# Patient Record
Sex: Female | Born: 1985 | Race: White | Hispanic: No | Marital: Married | Smoking: Former smoker
Health system: Southern US, Community
[De-identification: ages and names within clinical notes are randomized; demographics above are authoritative.]

## PROBLEM LIST (undated history)

## (undated) DIAGNOSIS — G43909 Migraine, unspecified, not intractable, without status migrainosus: Secondary | ICD-10-CM

## (undated) DIAGNOSIS — H3320 Serous retinal detachment, unspecified eye: Secondary | ICD-10-CM

## (undated) DIAGNOSIS — G35 Multiple sclerosis: Secondary | ICD-10-CM

## (undated) HISTORY — PX: EYE SURGERY: SHX253

---

## 2020-09-08 ENCOUNTER — Inpatient Hospital Stay (HOSPITAL_COMMUNITY)
Admission: EM | Admit: 2020-09-08 | Discharge: 2020-09-13 | DRG: 060 | Disposition: A | Discharge status: Home / Self Care | Payer: 59 | Attending: Internal Medicine | Admitting: Internal Medicine

## 2020-09-08 ENCOUNTER — Encounter (HOSPITAL_COMMUNITY): Payer: Self-pay

## 2020-09-08 DIAGNOSIS — Z87891 Personal history of nicotine dependence: Secondary | ICD-10-CM

## 2020-09-08 DIAGNOSIS — T380X5A Adverse effect of glucocorticoids and synthetic analogues, initial encounter: Secondary | ICD-10-CM | POA: Diagnosis present

## 2020-09-08 DIAGNOSIS — Z8669 Personal history of other diseases of the nervous system and sense organs: Secondary | ICD-10-CM

## 2020-09-08 DIAGNOSIS — Z20822 Contact with and (suspected) exposure to covid-19: Secondary | ICD-10-CM | POA: Diagnosis present

## 2020-09-08 DIAGNOSIS — R739 Hyperglycemia, unspecified: Secondary | ICD-10-CM

## 2020-09-08 DIAGNOSIS — E663 Overweight: Secondary | ICD-10-CM

## 2020-09-08 DIAGNOSIS — G35 Multiple sclerosis: Principal | ICD-10-CM | POA: Diagnosis present

## 2020-09-08 DIAGNOSIS — G43909 Migraine, unspecified, not intractable, without status migrainosus: Secondary | ICD-10-CM | POA: Diagnosis present

## 2020-09-08 DIAGNOSIS — R200 Anesthesia of skin: Secondary | ICD-10-CM | POA: Diagnosis present

## 2020-09-08 DIAGNOSIS — R55 Syncope and collapse: Secondary | ICD-10-CM

## 2020-09-08 DIAGNOSIS — R278 Other lack of coordination: Secondary | ICD-10-CM | POA: Diagnosis present

## 2020-09-08 DIAGNOSIS — Z6828 Body mass index (BMI) 28.0-28.9, adult: Secondary | ICD-10-CM

## 2020-09-08 DIAGNOSIS — Z833 Family history of diabetes mellitus: Secondary | ICD-10-CM

## 2020-09-08 HISTORY — DX: Serous retinal detachment, unspecified eye: H33.20

## 2020-09-08 HISTORY — DX: Migraine, unspecified, not intractable, without status migrainosus: G43.909

## 2020-09-08 HISTORY — DX: Multiple sclerosis: G35

## 2020-09-08 LAB — DIFFERENTIAL
Abs Immature Granulocytes: 0.01 10*3/uL (ref 0.00–0.07)
Basophils Absolute: 0 10*3/uL (ref 0.0–0.1)
Basophils Relative: 1 %
Eosinophils Absolute: 0.1 10*3/uL (ref 0.0–0.5)
Eosinophils Relative: 1 %
Immature Granulocytes: 0 %
Lymphocytes Relative: 35 %
Lymphs Abs: 2.7 10*3/uL (ref 0.7–4.0)
Monocytes Absolute: 0.5 10*3/uL (ref 0.1–1.0)
Monocytes Relative: 6 %
Neutro Abs: 4.5 10*3/uL (ref 1.7–7.7)
Neutrophils Relative %: 57 %

## 2020-09-08 LAB — I-STAT CHEM 8, ED
BUN: 14 mg/dL (ref 6–20)
Calcium, Ion: 1.23 mmol/L (ref 1.15–1.40)
Chloride: 103 mmol/L (ref 98–111)
Creatinine, Ser: 0.6 mg/dL (ref 0.44–1.00)
Glucose, Bld: 89 mg/dL (ref 70–99)
HCT: 40 % (ref 36.0–46.0)
Hemoglobin: 13.6 g/dL (ref 12.0–15.0)
Potassium: 3.6 mmol/L (ref 3.5–5.1)
Sodium: 139 mmol/L (ref 135–145)
TCO2: 26 mmol/L (ref 22–32)

## 2020-09-08 LAB — COMPREHENSIVE METABOLIC PANEL
ALT: 15 U/L (ref 0–44)
AST: 15 U/L (ref 15–41)
Albumin: 4.6 g/dL (ref 3.5–5.0)
Alkaline Phosphatase: 56 U/L (ref 38–126)
Anion gap: 12 (ref 5–15)
BUN: 12 mg/dL (ref 6–20)
CO2: 23 mmol/L (ref 22–32)
Calcium: 9.7 mg/dL (ref 8.9–10.3)
Chloride: 102 mmol/L (ref 98–111)
Creatinine, Ser: 0.65 mg/dL (ref 0.44–1.00)
GFR, Estimated: >60 mL/min (ref >60)
Glucose, Bld: 94 mg/dL (ref 70–99)
Potassium: 3.6 mmol/L (ref 3.5–5.1)
Sodium: 137 mmol/L (ref 135–145)
Total Bilirubin: 0.7 mg/dL (ref 0.3–1.2)
Total Protein: 7.5 g/dL (ref 6.5–8.1)

## 2020-09-08 LAB — CBC
HCT: 40.2 % (ref 36.0–46.0)
Hemoglobin: 14.2 g/dL (ref 12.0–15.0)
MCH: 31.9 pg (ref 26.0–34.0)
MCHC: 35.3 g/dL (ref 30.0–36.0)
MCV: 90.3 fL (ref 80.0–100.0)
Platelets: 226 10*3/uL (ref 150–400)
RBC: 4.45 MIL/uL (ref 3.87–5.11)
RDW: 11.9 % (ref 11.5–15.5)
WBC: 7.8 10*3/uL (ref 4.0–10.5)
nRBC: 0 % (ref 0.0–0.2)

## 2020-09-08 LAB — I-STAT BETA HCG BLOOD, ED (MC, WL, AP ONLY): I-stat hCG, quantitative: <5 m[IU]/mL (ref <5)

## 2020-09-08 LAB — PROTIME-INR
INR: 1 (ref 0.8–1.2)
Prothrombin Time: 12.4 seconds (ref 11.4–15.2)

## 2020-09-08 LAB — APTT: aPTT: 29 seconds (ref 24–36)

## 2020-09-08 MED ORDER — SODIUM CHLORIDE 0.9% FLUSH: 3.0000 mL | Freq: Once | INTRAVENOUS | Status: DC

## 2020-09-08 NOTE — ED Triage Notes (Signed)
Pt states that she has had a headache for a weak and slurred speech last week that has resolved, today had a syncopal episode, pt also reports bilateral hand weakness for the past 3 days.

## 2020-09-09 ENCOUNTER — Other Ambulatory Visit: Payer: Self-pay

## 2020-09-09 ENCOUNTER — Emergency Department (HOSPITAL_COMMUNITY): Payer: 59

## 2020-09-09 ENCOUNTER — Encounter (HOSPITAL_COMMUNITY): Payer: Self-pay | Admitting: Internal Medicine

## 2020-09-09 DIAGNOSIS — G35 Multiple sclerosis: Secondary | ICD-10-CM | POA: Diagnosis present

## 2020-09-09 DIAGNOSIS — G35D Multiple sclerosis, unspecified: Secondary | ICD-10-CM | POA: Diagnosis present

## 2020-09-09 DIAGNOSIS — R739 Hyperglycemia, unspecified: Secondary | ICD-10-CM | POA: Diagnosis not present

## 2020-09-09 LAB — CBG MONITORING, ED
Glucose-Capillary: 66 mg/dL — ABNORMAL LOW (ref 70–99)
Glucose-Capillary: 109 mg/dL — ABNORMAL HIGH (ref 70–99)
Glucose-Capillary: 85 mg/dL (ref 70–99)

## 2020-09-09 LAB — RESP PANEL BY RT-PCR (FLU A&B, COVID) ARPGX2
Influenza A by PCR: NEGATIVE
Influenza B by PCR: NEGATIVE
SARS Coronavirus 2 by RT PCR: NEGATIVE

## 2020-09-09 LAB — CBC
HCT: 40.4 % (ref 36.0–46.0)
Hemoglobin: 13.8 g/dL (ref 12.0–15.0)
MCH: 31.2 pg (ref 26.0–34.0)
MCHC: 34.2 g/dL (ref 30.0–36.0)
MCV: 91.2 fL (ref 80.0–100.0)
Platelets: 206 10*3/uL (ref 150–400)
RBC: 4.43 MIL/uL (ref 3.87–5.11)
RDW: 11.9 % (ref 11.5–15.5)
WBC: 8 10*3/uL (ref 4.0–10.5)
nRBC: 0 % (ref 0.0–0.2)

## 2020-09-09 LAB — CREATININE, SERUM
Creatinine, Ser: 0.61 mg/dL (ref 0.44–1.00)
GFR, Estimated: >60 mL/min (ref >60)

## 2020-09-09 MED ORDER — SODIUM CHLORIDE 0.9 % IV SOLN
1000.0000 mg | Freq: Every day | INTRAVENOUS | Status: AC
  Administered 2020-09-09 – 2020-09-13 (×5): 1000 mg via INTRAVENOUS
  Filled 2020-09-09 (×5): qty 8

## 2020-09-09 MED ORDER — PANTOPRAZOLE SODIUM 40 MG PO TBEC
40.0000 mg | DELAYED_RELEASE_TABLET | Freq: Every day | ORAL | Status: DC
  Administered 2020-09-10 – 2020-09-12 (×3): 40 mg via ORAL
  Filled 2020-09-09 (×3): qty 1

## 2020-09-09 MED ORDER — KETOROLAC TROMETHAMINE 30 MG/ML IJ SOLN
30.0000 mg | Freq: Once | INTRAMUSCULAR | Status: AC
  Administered 2020-09-09: 21:00:00 30 mg via INTRAVENOUS
  Filled 2020-09-09: qty 1

## 2020-09-09 MED ORDER — ENOXAPARIN SODIUM 40 MG/0.4ML ~~LOC~~ SOLN
40.0000 mg | SUBCUTANEOUS | Status: DC
  Administered 2020-09-09 – 2020-09-12 (×4): 40 mg via SUBCUTANEOUS
  Filled 2020-09-09 (×4): qty 0.4

## 2020-09-09 MED ORDER — ACETAMINOPHEN 325 MG PO TABS
650.0000 mg | ORAL_TABLET | Freq: Four times a day (QID) | ORAL | Status: DC | PRN
  Filled 2020-09-09: qty 2

## 2020-09-09 MED ORDER — INSULIN ASPART 100 UNIT/ML ~~LOC~~ SOLN: 0.0000 [IU] | Freq: Three times a day (TID) | SUBCUTANEOUS | Status: DC

## 2020-09-09 MED ORDER — ACETAMINOPHEN 650 MG RE SUPP: 650.0000 mg | Freq: Four times a day (QID) | RECTAL | Status: DC | PRN

## 2020-09-09 MED ORDER — GADOBUTROL 1 MMOL/ML IV SOLN: 8.0000 mL | Freq: Once | INTRAVENOUS | Status: DC | PRN

## 2020-09-09 MED ORDER — GADOBUTROL 1 MMOL/ML IV SOLN
8.0000 mL | Freq: Once | INTRAVENOUS | Status: AC | PRN
  Administered 2020-09-09: 8 mL via INTRAVENOUS

## 2020-09-09 NOTE — ED Notes (Signed)
Patient transported to MRI 

## 2020-09-09 NOTE — ED Notes (Addendum)
Pt stated that pt seen other pt going before pt, this NT stated that pt are place by acuity and pt stated that this NT notified pt was next. This NT stated that pt are placed by acuity and never told pt was next by this NT. Pt story changed to other colleagues notified pt was next, and that pt misunderstood when pt's was not important as other pt's. This NT stated that all pt's are important especially pt's care for their health.

## 2020-09-09 NOTE — ED Provider Notes (Signed)
Care assumed at shift change from [facility], [place], pending MRI brain and c-spine. See their note for full HPI and workup. Briefly, pt presenting with neuro symptoms concerning for MS flare as well as syncopal episode yesterday. Syncope workup is unremarkable. Plan for MRI to eval for findings concerning for active MS flare.   Patient reports 3-4 days of right face, arm, leg weakness. Intermittent slurred speech, right sided headache x1 week. Imbalance. Floaters in b/l vision. Feels like previous flare though usually on the left. Was evaluated previously at [facility] where she just moved her from this summer. Prior to that, [facility] in [place].  Per chart review and care everywhere, [facility] records, patient has history of retinal detachment of both eyes, optic neuritis, uveitis.  She had vitrectomy done on 10/13/2019. Physical Exam  BP 125/74 (BP Location: Left Arm)   Pulse 65   Temp 98.3 F (36.8 C) (Oral)   Resp 16   Ht 5\' 6"  (1.676 m)   Wt 79.4 kg   SpO2 100%   BMI 28.25 kg/m   Physical Exam Vitals and nursing note reviewed.  Constitutional:      General: She is not in acute distress.    Appearance: She is well-developed.  HENT:     Head: Normocephalic and atraumatic.  Eyes:     Conjunctiva/sclera: Conjunctivae normal.  Cardiovascular:     Rate and Rhythm: Normal rate.  Pulmonary:     Effort: Pulmonary effort is normal.  Neurological:     Mental Status: She is alert.     Comments: Speech is fluent. Spontaneously moving all extremities without difficulty.  Psychiatric:        Mood and Affect: Mood normal.        Behavior: Behavior normal.     ED Course/Procedures   Clinical Course as of 09/10/20 2321  Fri Sep 09, 2020  2022 Patient is hypoglycemic despite a full meal a few hours ago.  She is given juice and additional food. [JR]  2050 Consulted with neurologist, Dr. 2051.  He recommends medical admission, 1000 mg of methylprednisolone IV once  daily.  He will evaluate patient.  Discussed these recommendations with patient, she verbalized understanding and agrees with care plan for admission.  She is vaccinated against Covid, will send swab. [JR]    Clinical Course User Index [JR] Jaleiah Asay, Otelia Limes N, PA-C    Procedures  Results for orders placed or performed during the hospital encounter of 09/08/20  Resp Panel by RT-PCR (Flu A&B, Covid) Nasopharyngeal Swab   Specimen: Nasopharyngeal Swab; Nasopharyngeal(NP) swabs in vial transport medium  Result Value Ref Range   SARS Coronavirus 2 by RT PCR NEGATIVE NEGATIVE   Influenza A by PCR NEGATIVE NEGATIVE   Influenza B by PCR NEGATIVE NEGATIVE  Protime-INR  Result Value Ref Range   Prothrombin Time 12.4 11.4 - 15.2 seconds   INR 1.0 0.8 - 1.2  APTT  Result Value Ref Range   aPTT 29 24 - 36 seconds  CBC  Result Value Ref Range   WBC 7.8 4.0 - 10.5 K/uL   RBC 4.45 3.87 - 5.11 MIL/uL   Hemoglobin 14.2 12.0 - 15.0 g/dL   HCT 09/10/20 42.3 - 53.6 %   MCV 90.3 80.0 - 100.0 fL   MCH 31.9 26.0 - 34.0 pg   MCHC 35.3 30.0 - 36.0 g/dL   RDW 14.4 31.5 - 40.0 %   Platelets 226 150 - 400 K/uL   nRBC 0.0 0.0 -  0.2 %  Differential  Result Value Ref Range   Neutrophils Relative % 57 %   Neutro Abs 4.5 1.7 - 7.7 K/uL   Lymphocytes Relative 35 %   Lymphs Abs 2.7 0.7 - 4.0 K/uL   Monocytes Relative 6 %   Monocytes Absolute 0.5 0.1 - 1.0 K/uL   Eosinophils Relative 1 %   Eosinophils Absolute 0.1 0.0 - 0.5 K/uL   Basophils Relative 1 %   Basophils Absolute 0.0 0.0 - 0.1 K/uL   Immature Granulocytes 0 %   Abs Immature Granulocytes 0.01 0.00 - 0.07 K/uL  Comprehensive metabolic panel  Result Value Ref Range   Sodium 137 135 - 145 mmol/L   Potassium 3.6 3.5 - 5.1 mmol/L   Chloride 102 98 - 111 mmol/L   CO2 23 22 - 32 mmol/L   Glucose, Bld 94 70 - 99 mg/dL   BUN 12 6 - 20 mg/dL   Creatinine, Ser 0.62 0.44 - 1.00 mg/dL   Calcium 9.7 8.9 - 69.4 mg/dL   Total Protein 7.5 6.5 - 8.1 g/dL    Albumin 4.6 3.5 - 5.0 g/dL   AST 15 15 - 41 U/L   ALT 15 0 - 44 U/L   Alkaline Phosphatase 56 38 - 126 U/L   Total Bilirubin 0.7 0.3 - 1.2 mg/dL   GFR, Estimated >85 >46 mL/min   Anion gap 12 5 - 15  HIV Antibody (routine testing w rflx)  Result Value Ref Range   HIV Screen 4th Generation wRfx Reactive (A) Non Reactive  CBC  Result Value Ref Range   WBC 8.0 4.0 - 10.5 K/uL   RBC 4.43 3.87 - 5.11 MIL/uL   Hemoglobin 13.8 12.0 - 15.0 g/dL   HCT 27.0 35.0 - 09.3 %   MCV 91.2 80.0 - 100.0 fL   MCH 31.2 26.0 - 34.0 pg   MCHC 34.2 30.0 - 36.0 g/dL   RDW 81.8 29.9 - 37.1 %   Platelets 206 150 - 400 K/uL   nRBC 0.0 0.0 - 0.2 %  Creatinine, serum  Result Value Ref Range   Creatinine, Ser 0.61 0.44 - 1.00 mg/dL   GFR, Estimated >69 >67 mL/min  Basic metabolic panel  Result Value Ref Range   Sodium 139 135 - 145 mmol/L   Potassium 4.6 3.5 - 5.1 mmol/L   Chloride 103 98 - 111 mmol/L   CO2 24 22 - 32 mmol/L   Glucose, Bld 158 (H) 70 - 99 mg/dL   BUN 14 6 - 20 mg/dL   Creatinine, Ser 8.93 0.44 - 1.00 mg/dL   Calcium 81.0 8.9 - 17.5 mg/dL   GFR, Estimated >10 >25 mL/min   Anion gap 12 5 - 15  CBC  Result Value Ref Range   WBC 8.1 4.0 - 10.5 K/uL   RBC 4.54 3.87 - 5.11 MIL/uL   Hemoglobin 14.1 12.0 - 15.0 g/dL   HCT 85.2 77.8 - 24.2 %   MCV 89.4 80.0 - 100.0 fL   MCH 31.1 26.0 - 34.0 pg   MCHC 34.7 30.0 - 36.0 g/dL   RDW 35.3 61.4 - 43.1 %   Platelets 203 150 - 400 K/uL   nRBC 0.0 0.0 - 0.2 %  Glucose, capillary  Result Value Ref Range   Glucose-Capillary 205 (H) 70 - 99 mg/dL  Hemoglobin V4M  Result Value Ref Range   Hgb A1c MFr Bld 4.8 4.8 - 5.6 %   Mean Plasma Glucose 91.06 mg/dL  I-stat  chem 8, ED  Result Value Ref Range   Sodium 139 135 - 145 mmol/L   Potassium 3.6 3.5 - 5.1 mmol/L   Chloride 103 98 - 111 mmol/L   BUN 14 6 - 20 mg/dL   Creatinine, Ser 5.40 0.44 - 1.00 mg/dL   Glucose, Bld 89 70 - 99 mg/dL   Calcium, Ion 9.81 1.91 - 1.40 mmol/L   TCO2 26 22 - 32  mmol/L   Hemoglobin 13.6 12.0 - 15.0 g/dL   HCT 47.8 29.5 - 62.1 %  CBG monitoring, ED  Result Value Ref Range   Glucose-Capillary 66 (L) 70 - 99 mg/dL  I-Stat beta hCG blood, ED  Result Value Ref Range   I-stat hCG, quantitative <5.0 <5 mIU/mL   Comment 3          CBG monitoring, ED  Result Value Ref Range   Glucose-Capillary 109 (H) 70 - 99 mg/dL   Comment 1 Repeat Test   CBG monitoring, ED  Result Value Ref Range   Glucose-Capillary 85 70 - 99 mg/dL   MR Brain W and Wo Contrast  Result Date: 09/09/2020 CLINICAL DATA:  Multiple sclerosis.  New event. EXAM: MRI HEAD WITHOUT AND WITH CONTRAST TECHNIQUE: Multiplanar, multiecho pulse sequences of the brain and surrounding structures were obtained without and with intravenous contrast. CONTRAST:  8mL GADAVIST GADOBUTROL 1 MMOL/ML IV SOLN COMPARISON:  None. FINDINGS: Brain: Diffusion imaging does not show any true restricted diffusion. Multiple areas of low level T2 shine through related to multiple MS plaques. There is a 12 mm MS lesion of the left pons that show some swelling and contrast enhancement consistent with an active lesion. There is an 8 mm lesion in the left middle cerebellar peduncle with peripheral enhancement. No other posterior fossa lesion. Right cerebral hemisphere shows approximately 20-30 demyelinating lesions scattered within the deep and subcortical white matter. Several show contrast enhancement. The left hemisphere likewise shows 20-30 demyelinating lesions, many more showing abnormal contrast enhancement. Moderate involvement of the corpus callosum. No sign of neoplastic mass lesion, hemorrhage, hydrocephalus or extra-axial collection. Vascular: Major vessels at the base of the brain show flow. Skull and upper cervical spine: Negative Sinuses/Orbits: Clear sinuses.  Previous scleral banding procedures. Other: None IMPRESSION: MS lesions distributed throughout the brain including the pons, left cerebellum and extensively  throughout both cerebral hemispheres, many of which show abnormal contrast enhancement suggesting active nature. There are approximately 20-30 lesions within each cerebral hemisphere. Electronically Signed   By: Paulina Fusi M.D.   On: 09/09/2020 19:32   MR Cervical Spine W or Wo Contrast  Result Date: 09/09/2020 CLINICAL DATA:  Multiple sclerosis.  New clinical of vent. EXAM: MRI CERVICAL SPINE WITHOUT AND WITH CONTRAST TECHNIQUE: Multiplanar and multiecho pulse sequences of the cervical spine, to include the craniocervical junction and cervicothoracic junction, were obtained without and with intravenous contrast. CONTRAST:  8mL GADAVIST GADOBUTROL 1 MMOL/ML IV SOLN COMPARISON:  None. FINDINGS: Alignment: Normal Vertebrae: Normal Cord: No convincing or definite MS lesion of the cervical spine. I question the presence of a subtle lesion on the right at the C4 level. No other area of concern. No abnormal contrast enhancement occurs there or elsewhere. Posterior Fossa, vertebral arteries, paraspinal tissues: See results of brain MRI. Disc levels: No significant degenerative changes. No stenosis of the canal or foramina. IMPRESSION: No convincing or definite MS lesion in the cervical spine. I question the presence of a subtle lesion on the right at the C4 level. No  abnormal contrast enhancement. No other area of concern. Electronically Signed   By: Paulina Fusi M.D.   On: 09/09/2020 19:40    MDM  MRI with findings consistent with new MS lesions. Consulted with neurology, Dr. Otelia Limes, who reviewed imaging and recommends admission for IV steroids. Patient agreeable with plan.  Pt admitted to medicine service.     Kamillah Didonato, [place] N, PA-C 09/10/20 2326    Tilden Fossa, MD 09/12/20 1131

## 2020-09-09 NOTE — H&P (Signed)
History and Physical    Alice Travis WUJ:[id number] DOB: [birth year] DOA: 09/08/2020  PCP: Patient, No Pcp Per  Patient coming from: Home.  Chief Complaint: Right-sided weakness.  HPI: Alice Travis is a 34 y.o. female with history of multiple sclerosis and retinal detachment who has just recently moved from [place] with known history of multiple sclerosis not on any disease modifying medication has been experiencing right-sided upper and lower extremity and right facial numbness with weakness with right-sided headache for the last 4 days.  Has some difficulty with gait.  Denies any incontinence of urine or bowel and has been some floaters in both eyes.  ED Course: In the ER on exam patient has mild weakness of the right upper and lower extremity.  MRI brain and C-spine was done which shows features concerning for exacerbation of multiple sclerosis and on-call neurologist Dr. Otelia Limes was consulted and started on Solu-Medrol 1 g IV and admitted for further observation.  CBC and complete metabolic panel done on September 08, 2020 was unremarkable repeat urine is pending.  Covid test was negative.  EKG shows normal sinus rhythm.  Patient also was given Toradol for the headache.  Review of Systems: As per HPI, rest all negative.   Past Medical History:  Diagnosis Date  . Migraines   . MS (multiple sclerosis) (HCC)   . Retinal detachment     Past Surgical History:  Procedure Laterality Date  . EYE SURGERY       reports that she has quit smoking. She has never used smokeless tobacco. She reports previous alcohol use. She reports that she does not use drugs.  No Known Allergies  Family History  Problem Relation Age of Onset  . Diabetes Mellitus II Father     Prior to Admission medications   Not on File    Physical Exam: Constitutional: Moderately built and nourished. Vitals:   09/09/20 1446 09/09/20 1510 09/09/20 1730 09/09/20 2024  BP:  125/74 130/69 107/74  Pulse:   65 66 76  Resp:  16 15 18   Temp:  98.3 F (36.8 C)  98.7 F (37.1 C)  TempSrc:  Oral  Oral  SpO2:  100% 100% 100%  Weight: 79.4 kg     Height: 5\' 6"  (1.676 m)      Eyes: Anicteric no pallor. ENMT: No discharge from the ears eyes nose or mouth. Neck: No mass felt.  No neck rigidity. Respiratory: No rhonchi or crepitations. Cardiovascular: S1-S2 heard. Abdomen: Soft nontender bowel sounds present. Musculoskeletal: No edema. Skin: No rash. Neurologic: Alert awake oriented to time place and person.  Right upper and lower extremities around 4 x 5 in strength.  No facial asymmetry tongue is midline pupils equal and reacting to light. Psychiatric: Appears normal.  Normal affect.   Labs on Admission: I have personally reviewed following labs and imaging studies  CBC: Recent Labs  Lab 09/08/20 2023 09/08/20 2127  WBC 7.8  --   NEUTROABS 4.5  --   HGB 14.2 13.6  HCT 40.2 40.0  MCV 90.3  --   PLT 226  --    Basic Metabolic Panel: Recent Labs  Lab 09/08/20 2023 09/08/20 2127  NA 137 139  K 3.6 3.6  CL 102 103  CO2 23  --   GLUCOSE 94 89  BUN 12 14  CREATININE 0.65 0.60  CALCIUM 9.7  --    GFR: Estimated Creatinine Clearance: 106.3 mL/min (by C-G formula based on SCr of 0.6 mg/dL). Liver  Function Tests: Recent Labs  Lab 09/08/20 2023  AST 15  ALT 15  ALKPHOS 56  BILITOT 0.7  PROT 7.5  ALBUMIN 4.6   No results for input(s): LIPASE, AMYLASE in the last 168 hours. No results for input(s): AMMONIA in the last 168 hours. Coagulation Profile: Recent Labs  Lab 09/08/20 2023  INR 1.0   Cardiac Enzymes: No results for input(s): CKTOTAL, CKMB, CKMBINDEX, TROPONINI in the last 168 hours. BNP (last 3 results) No results for input(s): PROBNP in the last 8760 hours. HbA1C: No results for input(s): HGBA1C in the last 72 hours. CBG: Recent Labs  Lab 09/09/20 2018  GLUCAP 66*   Lipid Profile: No results for input(s): CHOL, HDL, LDLCALC, TRIG, CHOLHDL, LDLDIRECT  in the last 72 hours. Thyroid Function Tests: No results for input(s): TSH, T4TOTAL, FREET4, T3FREE, THYROIDAB in the last 72 hours. Anemia Panel: No results for input(s): VITAMINB12, FOLATE, FERRITIN, TIBC, IRON, RETICCTPCT in the last 72 hours. Urine analysis: No results found for: COLORURINE, APPEARANCEUR, LABSPEC, PHURINE, GLUCOSEU, HGBUR, BILIRUBINUR, KETONESUR, PROTEINUR, UROBILINOGEN, NITRITE, LEUKOCYTESUR Sepsis Labs: @LABRCNTIP (procalcitonin:4,lacticidven:4) )No results found for this or any previous visit (from the past 240 hour(s)).   Radiological Exams on Admission: MR Brain W and Wo Contrast  Result Date: 09/09/2020 CLINICAL DATA:  Multiple sclerosis.  New event. EXAM: MRI HEAD WITHOUT AND WITH CONTRAST TECHNIQUE: Multiplanar, multiecho pulse sequences of the brain and surrounding structures were obtained without and with intravenous contrast. CONTRAST:  20mL GADAVIST GADOBUTROL 1 MMOL/ML IV SOLN COMPARISON:  None. FINDINGS: Brain: Diffusion imaging does not show any true restricted diffusion. Multiple areas of low level T2 shine through related to multiple MS plaques. There is a 12 mm MS lesion of the left pons that show some swelling and contrast enhancement consistent with an active lesion. There is an 8 mm lesion in the left middle cerebellar peduncle with peripheral enhancement. No other posterior fossa lesion. Right cerebral hemisphere shows approximately 20-30 demyelinating lesions scattered within the deep and subcortical white matter. Several show contrast enhancement. The left hemisphere likewise shows 20-30 demyelinating lesions, many more showing abnormal contrast enhancement. Moderate involvement of the corpus callosum. No sign of neoplastic mass lesion, hemorrhage, hydrocephalus or extra-axial collection. Vascular: Major vessels at the base of the brain show flow. Skull and upper cervical spine: Negative Sinuses/Orbits: Clear sinuses.  Previous scleral banding procedures.  Other: None IMPRESSION: MS lesions distributed throughout the brain including the pons, left cerebellum and extensively throughout both cerebral hemispheres, many of which show abnormal contrast enhancement suggesting active nature. There are approximately 20-30 lesions within each cerebral hemisphere. Electronically Signed   By: 4m M.D.   On: 09/09/2020 19:32   MR Cervical Spine W or Wo Contrast  Result Date: 09/09/2020 CLINICAL DATA:  Multiple sclerosis.  New clinical of vent. EXAM: MRI CERVICAL SPINE WITHOUT AND WITH CONTRAST TECHNIQUE: Multiplanar and multiecho pulse sequences of the cervical spine, to include the craniocervical junction and cervicothoracic junction, were obtained without and with intravenous contrast. CONTRAST:  32mL GADAVIST GADOBUTROL 1 MMOL/ML IV SOLN COMPARISON:  None. FINDINGS: Alignment: Normal Vertebrae: Normal Cord: No convincing or definite MS lesion of the cervical spine. I question the presence of a subtle lesion on the right at the C4 level. No other area of concern. No abnormal contrast enhancement occurs there or elsewhere. Posterior Fossa, vertebral arteries, paraspinal tissues: See results of brain MRI. Disc levels: No significant degenerative changes. No stenosis of the canal or foramina. IMPRESSION: No convincing or definite  MS lesion in the cervical spine. I question the presence of a subtle lesion on the right at the C4 level. No abnormal contrast enhancement. No other area of concern. Electronically Signed   By: Paulina Fusi M.D.   On: 09/09/2020 19:40    EKG: Independently reviewed.  Normal sinus rhythm.  Assessment/Plan Principal Problem:   Multiple sclerosis exacerbation (HCC) Active Problems:   Multiple sclerosis (HCC)    1. Multiple sclerosis exacerbation for which Solu-Medrol 1 g IV has been started for 5 days.  We will keep patient on Protonix and also check sliding scale coverage.  Follow metabolic panel. 2. History of retinal detachment.   Has had surgery in January of this year. 3. History of migraines.  1 dose of Toradol was given.   DVT prophylaxis: Lovenox. Code Status: Full code. Family Communication: Discussed with patient. Disposition Plan: Home. Consults called: Neurology. Admission status: Observation.   Eduard Clos MD [facility] Pager [phone].  If 7PM-7AM, please contact night-coverage [email or web address] Password [facility]  09/09/2020, 9:34 PM

## 2020-09-09 NOTE — Consult Note (Signed)
NEURO HOSPITALIST CONSULT NOTE   Requestig physician: Dr. Toniann Fail  Reason for Consult: Right sided weakness and sensory loss in the context of a known history of MS  History obtained from:  Patient  and Chart    HPI:                                                                                                                                          Alice Travis is a 34 y.o. female with a history of MS, migraine headache and retinal detachment, who recently moved to [place] from [place] without yet establishing contact with a new Neurologist, who presents to the ED with a 3-4 day history of right face, arm and leg weakness in conjunction with sensory numbness, unsteady gait and right sided headache. She also complains of bilateral floaters in her vision. She feels as though she is having an MS exacerbation.   She has been off disease modifying medication since before moving from [place] to [place]. She states that she has been on Copaxone, but that she had recurrent exacerbations while on this medication. Rebif and Avonex were also tried, but she had unacceptable side effects without good efficacy of the medications. She states that the most effective disease-modifying medication she has been on was Tecfidera.   Of note, per [facility] records available on Care Everywhere, she has a history of retinal detachment OU, as well as prior optic neuritis and uveitis.  She had vitrectomy performed on 10/13/2019.  MRI of brain performed in the ED reveals multiple enhancing white matter lesions on a background of numerous T2-hyperintense chronic-appearing MS lesions in the cerebral hemispheres as well as infratentorially. MRI cervical spine shows no acute enhancing lesions; subtle T2-hyperintensity is seen within the cord at one location, appearing most consistent with a chronic demyelinating lesion; no significant spinal cord atrophy is appreciated.   Past Medical  History:  Diagnosis Date  . Migraines   . MS (multiple sclerosis) (HCC)   . Retinal detachment     No family history on file.            Social History:  has no history on file for tobacco use, alcohol use, and drug use.  No Known Allergies  HOME MEDICATIONS:  No current facility-administered medications on file prior to encounter.   Current Outpatient Medications on File Prior to Encounter  Medication Sig Dispense Refill  . acetaminophen (TYLENOL) 500 MG tablet Take 1,000 mg by mouth every 6 (six) hours as needed for mild pain or headache.    . carboxymethylcellulose (REFRESH PLUS) 0.5 % SOLN Place 1 drop into both eyes 3 (three) times daily as needed (for dry eyes).    . COLLAGEN PO Take 1 each by mouth in the morning.    . Multiple Vitamin (MULTIVITAMIN) tablet Take 1 tablet by mouth daily.       ROS:                                                                                                                                       As per HPI.   Blood pressure 107/74, pulse 76, temperature 98.7 F (37.1 C), temperature source Oral, resp. rate 18, height 5\' 6"  (1.676 m), weight 79.4 kg, SpO2 100 %.   General Examination:                                                                                                       Physical Exam  HEENT-  Stallings/AT    Lungs- Respirations unlabored Extremities- Warm and well-perfused  Neurological Examination Mental Status: Alert, oriented x 5, thought content appropriate.  Pleasant and cooperative. Speech fluent with intact comprehension and naming.  Able to follow all commands without difficulty. Cranial Nerves: II: Visual fields intact in all four quadrants of each eye. No extinction to DSS.  III,IV, VI: No ptosis. EOMI. No nystagmus.  V,VII: Smile symmetric. Temp sensation equal bilaterally VIII: Hearing intact to  conversation IX,X: Palate rises symmetrically XI: Slight lag on the right with shoulder shrug XII: Midline tongue extension Motor: RUE 4/5 proximally and distally RLE 4-/5 proximally and distally with significant fatiguability LUE and LLE 5/5 Normal muscle bulk and tone x 4 Sensory: Decreased temp and FT sensation to RUE and RLE. No extinction to DSS.  Deep Tendon Reflexes: 2+ bilateral brachioradialis, biceps, triceps, patellae and achilles.  Toes downgoing bilaterally Negative Hoffman's bilaterally  Cerebellar: No ataxia with FNF bilaterally  Gait: Deferred   Lab Results: Basic Metabolic Panel: Recent Labs  Lab 09/08/20 2023 09/08/20 2127  NA 137 139  K 3.6 3.6  CL 102 103  CO2 23  --   GLUCOSE 94 89  BUN 12 14  CREATININE 0.65 0.60  CALCIUM 9.7  --  CBC: Recent Labs  Lab 09/08/20 2023 09/08/20 2127  WBC 7.8  --   NEUTROABS 4.5  --   HGB 14.2 13.6  HCT 40.2 40.0  MCV 90.3  --   PLT 226  --     Cardiac Enzymes: No results for input(s): CKTOTAL, CKMB, CKMBINDEX, TROPONINI in the last 168 hours.  Lipid Panel: No results for input(s): CHOL, TRIG, HDL, CHOLHDL, VLDL, LDLCALC in the last 168 hours.  Imaging: MR Brain W and Wo Contrast  Result Date: 09/09/2020 CLINICAL DATA:  Multiple sclerosis.  New event. EXAM: MRI HEAD WITHOUT AND WITH CONTRAST TECHNIQUE: Multiplanar, multiecho pulse sequences of the brain and surrounding structures were obtained without and with intravenous contrast. CONTRAST:  37mL GADAVIST GADOBUTROL 1 MMOL/ML IV SOLN COMPARISON:  None. FINDINGS: Brain: Diffusion imaging does not show any true restricted diffusion. Multiple areas of low level T2 shine through related to multiple MS plaques. There is a 12 mm MS lesion of the left pons that show some swelling and contrast enhancement consistent with an active lesion. There is an 8 mm lesion in the left middle cerebellar peduncle with peripheral enhancement. No other posterior fossa lesion.  Right cerebral hemisphere shows approximately 20-30 demyelinating lesions scattered within the deep and subcortical white matter. Several show contrast enhancement. The left hemisphere likewise shows 20-30 demyelinating lesions, many more showing abnormal contrast enhancement. Moderate involvement of the corpus callosum. No sign of neoplastic mass lesion, hemorrhage, hydrocephalus or extra-axial collection. Vascular: Major vessels at the base of the brain show flow. Skull and upper cervical spine: Negative Sinuses/Orbits: Clear sinuses.  Previous scleral banding procedures. Other: None IMPRESSION: MS lesions distributed throughout the brain including the pons, left cerebellum and extensively throughout both cerebral hemispheres, many of which show abnormal contrast enhancement suggesting active nature. There are approximately 20-30 lesions within each cerebral hemisphere. Electronically Signed   By: Paulina Fusi M.D.   On: 09/09/2020 19:32   MR Cervical Spine W or Wo Contrast  Result Date: 09/09/2020 CLINICAL DATA:  Multiple sclerosis.  New clinical of vent. EXAM: MRI CERVICAL SPINE WITHOUT AND WITH CONTRAST TECHNIQUE: Multiplanar and multiecho pulse sequences of the cervical spine, to include the craniocervical junction and cervicothoracic junction, were obtained without and with intravenous contrast. CONTRAST:  61mL GADAVIST GADOBUTROL 1 MMOL/ML IV SOLN COMPARISON:  None. FINDINGS: Alignment: Normal Vertebrae: Normal Cord: No convincing or definite MS lesion of the cervical spine. I question the presence of a subtle lesion on the right at the C4 level. No other area of concern. No abnormal contrast enhancement occurs there or elsewhere. Posterior Fossa, vertebral arteries, paraspinal tissues: See results of brain MRI. Disc levels: No significant degenerative changes. No stenosis of the canal or foramina. IMPRESSION: No convincing or definite MS lesion in the cervical spine. I question the presence of a subtle  lesion on the right at the C4 level. No abnormal contrast enhancement. No other area of concern. Electronically Signed   By: Paulina Fusi M.D.   On: 09/09/2020 19:40    Assessment: 34 year old female with MS, presenting with acute exacerbation.  1. Exam reveals reveals right sided weakness in conjunction with right sided sensory deficits.  2. MRI brain reveals MS lesions distributed throughout the brain including the pons, left cerebellum and extensively throughout both cerebral hemispheres, many of which show abnormal contrast enhancement suggesting active nature. There are approximately 20-30 lesions within each cerebral hemisphere. 3. MRI cervical spine with a subtle lesion on the right at the  C4 level. No abnormal contrast enhancement.    Recommendations: 1. Start IV methyprednisolone at 1000 mg qd x 5 days.  2. Daily CBC and chem7 while on IV steroids.  3. Scheduled CBG to assess for possible development of hyperglycemia. 4. PT/OT 5. Will need outpatient Neurology follow up with Dr. Epimenio Foot of [facility].  6. Consider restarting the patient on Tecfidera (dimethyl fumarate) as an outpatient. Possible side effects include flushing, hot flashes, upset stomach and malaise. PML is a rare side effect, therefore this medication should not be restarted until she has been seen by Dr. Epimenio Foot in his MS clinic.    Electronically signed: Dr. Caryl Pina 09/09/2020, 8:49 PM

## 2020-09-09 NOTE — ED Provider Notes (Signed)
[facility] EMERGENCY DEPARTMENT Provider Note   CSN: [id number] Arrival date & time: 09/08/20  1747     History Chief Complaint  Patient presents with  . Loss of Consciousness    Alice Travis is a 34 y.o. female with past medical history significant for migraines, MS, retinal detachment.  HPI Patient presents to emergency room today with chief complaint of loss of consciousness yesterday.  Patient states for the last week she has had a headache and intermittent slurred speech.  She states those symptoms have resolved.  Yesterday she was home watching her 30-month-old son.  She states she had gotten up to walk to the kitchen when she felt lightheaded.  She states she fell to the ground and lost consciousness. Does not think she hit her head, no tongue biting or bladder incontinence.  Unsure exactly how long, estimating for only a few seconds.  She states for the last week she has had a feeling of off balance.  She feels like she is leaning to her right.  She is also having bilateral hand weakness and black floaters in both eyes for the last x3 days. She states her MS flare in the past did not have similar symptoms however she is concerned she is having one now.  No medications taken for symptoms prior to arrival. Patient recently moved from [place] and has not established care with a neurologist here in [place] yet.  She take multivitamin daily, no other medications.  No recent antibiotic use. Denies drug use. Patient does states she had a cold recently that resolved with over-the-counter treatments. Denies fever, chills, headache, neck pain, shortness of breath, chest pain, palpitations, back pain, numbness, tingling. Denies family history of heart disease or HOCM.      Past Medical History:  Diagnosis Date  . Migraines   . MS (multiple sclerosis) (HCC)   . Retinal detachment     There are no problems to display for this patient.     OB History   No  obstetric history on file.     No family history on file.     Home Medications Prior to Admission medications   Not on File    Allergies    Patient has no known allergies.  Review of Systems   Review of Systems All other systems are reviewed and are negative for acute change except as noted in the HPI.  Physical Exam Updated Vital Signs BP 122/76 (BP Location: Right Arm)   Pulse 64   Temp 98.3 F (36.8 C) (Oral)   Resp 16   SpO2 100%   Physical Exam Vitals and nursing note reviewed.  Constitutional:      General: She is not in acute distress.    Appearance: She is not ill-appearing.  HENT:     Head: Normocephalic and atraumatic. No raccoon eyes or Battle's sign.     Jaw: There is normal jaw occlusion.     Comments: No tenderness to palpation of skull. No deformities or crepitus noted. No open wounds, abrasions or lacerations.    Right Ear: Tympanic membrane and external ear normal.     Left Ear: Tympanic membrane and external ear normal.     Nose: Nose normal.     Mouth/Throat:     Mouth: Mucous membranes are moist.     Pharynx: Oropharynx is clear.     Comments: No wounds noted to tongue or oral mucosa Eyes:     General: No scleral icterus.  Right eye: No discharge.        Left eye: No discharge.     Extraocular Movements: Extraocular movements intact.     Conjunctiva/sclera: Conjunctivae normal.     Pupils: Pupils are equal, round, and reactive to light.  Neck:     Vascular: No JVD.     Comments: Full ROM intact without spinous process TTP. No bony stepoffs or deformities, no paraspinous muscle TTP or muscle spasms. No rigidity or meningeal signs. No bruising, erythema, or swelling.  Cardiovascular:     Rate and Rhythm: Normal rate and regular rhythm.     Pulses: Normal pulses.          Radial pulses are 2+ on the right side and 2+ on the left side.     Heart sounds: Normal heart sounds. No murmur heard.   Pulmonary:     Comments: Lungs clear to  auscultation in all fields. Symmetric chest rise. No wheezing, rales, or rhonchi. Abdominal:     Comments: Abdomen is soft, non-distended, and non-tender in all quadrants. No rigidity, no guarding. No peritoneal signs.  Musculoskeletal:        General: Normal range of motion.     Cervical back: Normal range of motion.  Skin:    General: Skin is warm and dry.     Capillary Refill: Capillary refill takes less than 2 seconds.  Neurological:     Mental Status: She is oriented to person, place, and time.     GCS: GCS eye subscore is 4. GCS verbal subscore is 5. GCS motor subscore is 6.     Comments: Speech is clear and goal oriented, follows commands CN III-XII intact, no facial droop  Strength 4/5 on left upper extremity and 3-5 on right upper extremity. Moderate grip strength. Normal strength in lower extremities bilaterally including dorsiflexion and plantar flexion,  Sensation normal to light and sharp touch Moves extremities without ataxia, coordination intact Normal finger to nose and rapid alternating movements Slow and wide gait with ambulation   Psychiatric:        Behavior: Behavior normal.     ED Results / Procedures / Treatments   Labs (all labs ordered are listed, but only abnormal results are displayed) Labs Reviewed  PROTIME-INR  APTT  CBC  DIFFERENTIAL  COMPREHENSIVE METABOLIC PANEL  I-STAT CHEM 8, ED  I-STAT BETA HCG BLOOD, ED ([facility], [facility], [facility] ONLY)  CBG MONITORING, ED    EKG EKG Interpretation  Date/Time:  Thursday September 08 2020 20:09:02 EST Ventricular Rate:  88 PR Interval:  142 QRS Duration: 74 QT Interval:  340 QTC Calculation: 411 R Axis:   44 Text Interpretation: Normal sinus rhythm Normal ECG No old tracing to compare Confirmed by Jacalyn Lefevre [phone]) on 09/09/2020 10:24:54 AM   Radiology No results found.  Procedures .1-3 Lead EKG Interpretation Performed by: Shanon Ace, PA-C Authorized by: Shanon Ace, PA-C      Interpretation: normal     ECG rate:  88   ECG rate assessment: normal     Rhythm: sinus rhythm     Ectopy: none     Conduction: normal     (including critical care time)  Medications Ordered in ED Medications  sodium chloride flush (NS) 0.9 % injection 3 mL (has no administration in time range)    ED Course  I have reviewed the triage vital signs and the nursing notes.  Pertinent labs & imaging results that were available during my care of  the patient were reviewed by me and considered in my medical decision making (see chart for details).    MDM Rules/Calculators/A&P                          History provided by patient with additional history obtained from chart review.    No signs of head or neck trauma on exam. No findings to suggest seizure activity. Neuro exam with bilateral hand weakness R>L. When ambulating gait is slow and wide. Reflexes normal. Not orthostatic.  Work up initiated in triage. EKG shows NSR. CBC, CMP, PTT, INR all unremarkable. Pregnancy test negative. She had prodrom of symptoms prior to syncope. [place] syncope Rule score of 0.   Concern for MS flare. Discussed patient with ED attending Dr. Particia Nearing who agrees with plan for MR brain and cervical spine.   Patient care transferred to J. Roxan Hockey PA-C at the end of my shift pending MRIs. Patient presentation, ED course, and plan of care discussed with review of all pertinent labs and imaging. Please see her note for further details regarding further ED course and disposition.   Portions of this note were generated with Scientist, clinical (histocompatibility and immunogenetics). Dictation errors may occur despite best attempts at proofreading.   Final Clinical Impression(s) / ED Diagnoses Final diagnoses:  Weakness  Syncope, unspecified syncope type    Rx / DC Orders ED Discharge Orders    None       Kandice Hams 09/09/20 1525    Jacalyn Lefevre, MD 09/10/20 [id number]

## 2020-09-10 DIAGNOSIS — R200 Anesthesia of skin: Secondary | ICD-10-CM | POA: Diagnosis present

## 2020-09-10 DIAGNOSIS — G35 Multiple sclerosis: Secondary | ICD-10-CM | POA: Diagnosis present

## 2020-09-10 DIAGNOSIS — Z833 Family history of diabetes mellitus: Secondary | ICD-10-CM | POA: Diagnosis not present

## 2020-09-10 DIAGNOSIS — R278 Other lack of coordination: Secondary | ICD-10-CM | POA: Diagnosis present

## 2020-09-10 DIAGNOSIS — E663 Overweight: Secondary | ICD-10-CM | POA: Diagnosis present

## 2020-09-10 DIAGNOSIS — Z20822 Contact with and (suspected) exposure to covid-19: Secondary | ICD-10-CM | POA: Diagnosis present

## 2020-09-10 DIAGNOSIS — R739 Hyperglycemia, unspecified: Secondary | ICD-10-CM | POA: Diagnosis present

## 2020-09-10 DIAGNOSIS — Z87891 Personal history of nicotine dependence: Secondary | ICD-10-CM | POA: Diagnosis not present

## 2020-09-10 DIAGNOSIS — G43909 Migraine, unspecified, not intractable, without status migrainosus: Secondary | ICD-10-CM | POA: Diagnosis present

## 2020-09-10 DIAGNOSIS — Z6828 Body mass index (BMI) 28.0-28.9, adult: Secondary | ICD-10-CM | POA: Diagnosis not present

## 2020-09-10 DIAGNOSIS — T380X5A Adverse effect of glucocorticoids and synthetic analogues, initial encounter: Secondary | ICD-10-CM | POA: Diagnosis present

## 2020-09-10 DIAGNOSIS — Z8669 Personal history of other diseases of the nervous system and sense organs: Secondary | ICD-10-CM | POA: Diagnosis not present

## 2020-09-10 LAB — BASIC METABOLIC PANEL
Anion gap: 12 (ref 5–15)
BUN: 14 mg/dL (ref 6–20)
CO2: 24 mmol/L (ref 22–32)
Calcium: 10 mg/dL (ref 8.9–10.3)
Chloride: 103 mmol/L (ref 98–111)
Creatinine, Ser: 0.71 mg/dL (ref 0.44–1.00)
GFR, Estimated: >60 mL/min (ref >60)
Glucose, Bld: 158 mg/dL — ABNORMAL HIGH (ref 70–99)
Potassium: 4.6 mmol/L (ref 3.5–5.1)
Sodium: 139 mmol/L (ref 135–145)

## 2020-09-10 LAB — CBC
HCT: 40.6 % (ref 36.0–46.0)
Hemoglobin: 14.1 g/dL (ref 12.0–15.0)
MCH: 31.1 pg (ref 26.0–34.0)
MCHC: 34.7 g/dL (ref 30.0–36.0)
MCV: 89.4 fL (ref 80.0–100.0)
Platelets: 203 10*3/uL (ref 150–400)
RBC: 4.54 MIL/uL (ref 3.87–5.11)
RDW: 11.6 % (ref 11.5–15.5)
WBC: 8.1 10*3/uL (ref 4.0–10.5)
nRBC: 0 % (ref 0.0–0.2)

## 2020-09-10 LAB — HIV ANTIBODY (ROUTINE TESTING W REFLEX): HIV Screen 4th Generation wRfx: REACTIVE — AB

## 2020-09-10 LAB — HEMOGLOBIN A1C
Hgb A1c MFr Bld: 4.8 % (ref 4.8–5.6)
Mean Plasma Glucose: 91.06 mg/dL

## 2020-09-10 LAB — GLUCOSE, CAPILLARY: Glucose-Capillary: 205 mg/dL — ABNORMAL HIGH (ref 70–99)

## 2020-09-10 MED ORDER — KETOROLAC TROMETHAMINE 15 MG/ML IJ SOLN
15.0000 mg | Freq: Four times a day (QID) | INTRAMUSCULAR | Status: DC | PRN
  Administered 2020-09-10: 15 mg via INTRAVENOUS
  Administered 2020-09-10: 30 mg via INTRAVENOUS
  Administered 2020-09-11 – 2020-09-12 (×2): 15 mg via INTRAVENOUS
  Filled 2020-09-10 (×4): qty 1

## 2020-09-10 MED ORDER — KETOROLAC TROMETHAMINE 30 MG/ML IJ SOLN: 30.0000 mg | Freq: Once | INTRAMUSCULAR | Status: DC

## 2020-09-10 MED ORDER — INSULIN ASPART 100 UNIT/ML ~~LOC~~ SOLN
0.0000 [IU] | Freq: Three times a day (TID) | SUBCUTANEOUS | Status: DC
  Administered 2020-09-10: 18:00:00 3 [IU] via SUBCUTANEOUS
  Administered 2020-09-11 (×2): 2 [IU] via SUBCUTANEOUS
  Administered 2020-09-11: 09:00:00 1 [IU] via SUBCUTANEOUS
  Administered 2020-09-12: 19:00:00 3 [IU] via SUBCUTANEOUS
  Administered 2020-09-13: 13:00:00 1 [IU] via SUBCUTANEOUS

## 2020-09-10 NOTE — Progress Notes (Signed)
PROGRESS NOTE    Alice Travis  ZOX:[id number] DOB: Feb 19, 1986 DOA: 09/08/2020 PCP: Patient, No Pcp Per    Brief Narrative:  Mrs. Alice Travis was admitted to the hospital with a working diagnosis of multiple sclerosis exacerbation.  34 year old female past medical history for multiple sclerosis and retinal detachment who just moved from [place].  Reported right-sided upper and lower extremity weakness, right facial numbness along with right-sided headache for about 4 days.  Difficulty ambulating.  On her initial physical examination blood pressure 125/74, heart rate 65, respiratory rate 16, temperature 98.3, oxygen saturation 100%, lungs were clear to auscultation bilaterally, heart S1-S2, present rhythmic, soft abdomen, no lower extremity edema. Sodium 137, potassium 3.6, chloride 102, bicarb 23, glucose 94, BUN 12, creatinine 0.65, white count 7.8, hemoglobin 14.2, hematocrit 40.2, platelets 226. SARS COVID-19 negative, HIV screen positive.  Brain MRI with MS lesions distributed throughout the brain including the pons, left cerebellum and extensively through the both cerebral hemispheres.  Many suggesting active nature. Cervical MRI with no convincing or definite MS lesion. Sublte lesion on the right at the C4 level.  EKG 88 bpm, normal axis, normal intervals, sinus rhythm, no ST segment or T wave changes.  Patient was evaluated by neurology, recommendations for high-dose methylprednisolone, 1000 mg daily for 5 days.   Assessment & Plan:   Principal Problem:   Multiple sclerosis exacerbation (HCC) Active Problems:   Multiple sclerosis (HCC)   1. Multiple sclerosis flare. Patient has been placed on high dose steroids, per neurology recommendations. Right hand strength has been improving but not yet back to baseline. Continue neuro checks per unit protocol. Will consult PT and OT for mobility.   Resume as needed ketorolac for headache. Add GI prophylaxis with  pantoprazole. Patient has been euglucemic, will discontinue sliding scale insulin but will continue to check capillary glucose as needed.   2. HIV screen positive. Discriminatory essay pending.   3. Hx of retinal detachment. Follow with ophthalmology as outpatient   Patient continue high risk for worsening neurologic deficit.   Status is: Observation  The patient will require care spanning > 2 midnights and should be moved to inpatient because: IV treatments appropriate due to intensity of illness or inability to take PO  Dispo: The patient is from: Home              Anticipated d/c is to: Home              Anticipated d/c date is: > 3 days              Patient currently is not medically stable to d/c.    DVT prophylaxis: Enoxaparin   Code Status:   full  Family Communication:  No family at the bedside     Consultants:   Neurology     Subjective: Patient complains of headache, right hand strength is getting better, no nausea or vomiting, positive headache.   Objective: Vitals:   09/10/20 0156 09/10/20 0206 09/10/20 0639 09/10/20 0806  BP: 110/70 106/79 101/60 112/79  Pulse: (!) 57 63 61 81  Resp: 18 18 17 18   Temp: 98.4 F (36.9 C) 98.8 F (37.1 C) 97.7 F (36.5 C) 98.5 F (36.9 C)  TempSrc: Oral Oral Oral Oral  SpO2: 97% 98% 98% 99%  Weight:      Height:        Intake/Output Summary (Last 24 hours) at 09/10/2020 1458 Last data filed at 09/10/2020 0900 Gross per 24 hour  Intake 296.07  ml  Output --  Net 296.07 ml   Filed Weights   09/09/20 1446  Weight: 79.4 kg    Examination:   General: Not in pain or dyspnea, deconditioned  Neurology: Awake and alert, non focal, decreased hand grip strength on the right  E ENT: no pallor, no icterus, oral mucosa moist Cardiovascular: No JVD. S1-S2 present, rhythmic, no gallops, rubs, or murmurs. No lower extremity edema. Pulmonary: positive breath sounds bilaterally, adequate air movement, no wheezing, rhonchi or  rales. Gastrointestinal. Abdomen soft and non tender Skin. No rashes Musculoskeletal: no joint deformities     Data Reviewed: I have personally reviewed following labs and imaging studies  CBC: Recent Labs  Lab 09/08/20 2023 09/08/20 2127 09/09/20 2224 09/10/20 0603  WBC 7.8  --  8.0 8.1  NEUTROABS 4.5  --   --   --   HGB 14.2 13.6 13.8 14.1  HCT 40.2 40.0 40.4 40.6  MCV 90.3  --  91.2 89.4  PLT 226  --  206 203   Basic Metabolic Panel: Recent Labs  Lab 09/08/20 2023 09/08/20 2127 09/09/20 2224 09/10/20 0603  NA 137 139  --  139  K 3.6 3.6  --  4.6  CL 102 103  --  103  CO2 23  --   --  24  GLUCOSE 94 89  --  158*  BUN 12 14  --  14  CREATININE 0.65 0.60 0.61 0.71  CALCIUM 9.7  --   --  10.0   GFR: Estimated Creatinine Clearance: 106.3 mL/min (by C-G formula based on SCr of 0.71 mg/dL). Liver Function Tests: Recent Labs  Lab 09/08/20 2023  AST 15  ALT 15  ALKPHOS 56  BILITOT 0.7  PROT 7.5  ALBUMIN 4.6   No results for input(s): LIPASE, AMYLASE in the last 168 hours. No results for input(s): AMMONIA in the last 168 hours. Coagulation Profile: Recent Labs  Lab 09/08/20 2023  INR 1.0   Cardiac Enzymes: No results for input(s): CKTOTAL, CKMB, CKMBINDEX, TROPONINI in the last 168 hours. BNP (last 3 results) No results for input(s): PROBNP in the last 8760 hours. HbA1C: No results for input(s): HGBA1C in the last 72 hours. CBG: Recent Labs  Lab 09/09/20 2018 09/09/20 2204 09/09/20 2233  GLUCAP 66* 109* 85   Lipid Profile: No results for input(s): CHOL, HDL, LDLCALC, TRIG, CHOLHDL, LDLDIRECT in the last 72 hours. Thyroid Function Tests: No results for input(s): TSH, T4TOTAL, FREET4, T3FREE, THYROIDAB in the last 72 hours. Anemia Panel: No results for input(s): VITAMINB12, FOLATE, FERRITIN, TIBC, IRON, RETICCTPCT in the last 72 hours.    Radiology Studies: I have reviewed all of the imaging during this hospital visit  personally     Scheduled Meds: . enoxaparin (LOVENOX) injection  40 mg Subcutaneous Q24H  . pantoprazole  40 mg Oral Q1200   Continuous Infusions: . methylPREDNISolone (SOLU-MEDROL) injection 1,000 mg (09/10/20 1034)     LOS: 0 days        Regine Alice Annett Gula, MD

## 2020-09-10 NOTE — Progress Notes (Signed)
      INFECTIOUS DISEASE ATTENDING ADDENDUM:   Date: 09/10/2020  Patient name: Alice Travis  Medical record number: [id number]  Date of birth: 1986-06-16   Patient's first step in HIV 4th gen assay is +  Discriminatory assay pending  HIV quant RNA is ordered which is good since we are seeing sig lag times in HIV discim and HIV qual RNA  I will followup tests and if pt has HIV we will formally consult     Paulette Blanch Dam 09/10/2020, 1:34 PM

## 2020-09-11 DIAGNOSIS — Z8669 Personal history of other diseases of the nervous system and sense organs: Secondary | ICD-10-CM

## 2020-09-11 DIAGNOSIS — R739 Hyperglycemia, unspecified: Secondary | ICD-10-CM

## 2020-09-11 DIAGNOSIS — E663 Overweight: Secondary | ICD-10-CM

## 2020-09-11 DIAGNOSIS — T380X5A Adverse effect of glucocorticoids and synthetic analogues, initial encounter: Secondary | ICD-10-CM

## 2020-09-11 LAB — GLUCOSE, CAPILLARY
Glucose-Capillary: 138 mg/dL — ABNORMAL HIGH (ref 70–99)
Glucose-Capillary: 158 mg/dL — ABNORMAL HIGH (ref 70–99)
Glucose-Capillary: 173 mg/dL — ABNORMAL HIGH (ref 70–99)
Glucose-Capillary: 144 mg/dL — ABNORMAL HIGH (ref 70–99)

## 2020-09-11 NOTE — Progress Notes (Addendum)
PROGRESS NOTE    Scout Guyett  DGU:[id number] DOB: 1985-10-24 DOA: 09/08/2020 PCP: Patient, No Pcp Per    Brief Narrative:  [name] was admitted to the hospital with a working diagnosis of multiple sclerosis exacerbation.  34 year old female past medical history for multiple sclerosis and retinal detachment who just moved from [place].  Reported right-sided upper and lower extremity weakness, right facial numbness along with right-sided headache for about 4 days.  Difficulty ambulating.  On her initial physical examination blood pressure 125/74, heart rate 65, respiratory rate 16, temperature 98.3, oxygen saturation 100%, lungs were clear to auscultation bilaterally, heart S1-S2, present rhythmic, soft abdomen, no lower extremity edema. Sodium 137, potassium 3.6, chloride 102, bicarb 23, glucose 94, BUN 12, creatinine 0.65, white count 7.8, hemoglobin 14.2, hematocrit 40.2, platelets 226. SARS COVID-19 negative, HIV screen positive.  Brain MRI with MS lesions distributed throughout the brain including the pons, left cerebellum and extensively through the both cerebral hemispheres.  Many suggesting active nature. Cervical MRI with no convincing or definite MS lesion. Sublte lesion on the right at the C4 level.  EKG 88 bpm, normal axis, normal intervals, sinus rhythm, no ST segment or T wave changes.  Patient was evaluated by neurology, recommendations for high-dose methylprednisolone, 1000 mg daily for 5 days.   Assessment & Plan:   Principal Problem:   Multiple sclerosis exacerbation (HCC) Active Problems:   Multiple sclerosis (HCC)   Steroid-induced hyperglycemia   History of retinal detachment   Overweight (BMI 25.0-29.9)   1. Multiple sclerosis flare. Tolerating well IV steroids high dose 1000 mg/day #3/5. Patient reports improvement in neurologic function on the right side not yet back to baseline.   Pending PT and OT evaluation, out of bed to chair tid with  meals.  Continue ketorolac for headache as needed.  Will check with neurology if patient will need oral prednisone at discharge.  Continue GI prophylaxis with pantoprazole.   2.   Steroid induced hyperglycemia. Capillary glucose 85, 205, 138, 158 mg/dl. Continue with insulin sliding scale for glucose cover and monitoring. Calculate requirements, may need basal insulin.  Patient is tolerating po well.   2. HIV screen positive. Discriminatory essay, per ID recommendations.   3. Hx of retinal detachment. Ophthalmology as outpatient   4. Overweight. BMI 28,2.   Status is: Inpatient  Remains inpatient appropriate because:IV treatments appropriate due to intensity of illness or inability to take PO   Dispo: The patient is from: Home              Anticipated d/c is to: Home              Anticipated d/c date is: 2 days              Patient currently is not medically stable to d/c.   DVT prophylaxis: Enoxaparin   Code Status:    full  Family Communication:  No family at the bedside    Consultants:   Neurology     Subjective: Patient with improvement in her balance and strength, not yet back to baseline but clinically improving.   Objective: Vitals:   09/10/20 1601 09/10/20 1959 09/11/20 0431 09/11/20 0740  BP: 116/72 114/70 102/68 125/75  Pulse: 79 81 60 73  Resp: 20 18 16 16   Temp: 97.9 F (36.6 C) 98.9 F (37.2 C) 98.7 F (37.1 C) 97.7 F (36.5 C)  TempSrc: Oral Oral Oral Oral  SpO2: 97% 96% 97% 96%  Weight:  Height:        Intake/Output Summary (Last 24 hours) at 09/11/2020 1132 Last data filed at 09/11/2020 1015 Gross per 24 hour  Intake 720 ml  Output --  Net 720 ml   Filed Weights   09/09/20 1446  Weight: 79.4 kg    Examination:   General: Not in pain or dyspnea, deconditioned  Neurology: Awake and alert right sided weakness improving E ENT: no pallor, no icterus, oral mucosa moist Cardiovascular: No JVD. S1-S2 present, rhythmic, no  gallops, rubs, or murmurs. No lower extremity edema. Pulmonary: positive breath sounds bilaterally, adequate air movement, no wheezing, rhonchi or rales. Gastrointestinal. Abdomen soft and non tender Skin. No rashes Musculoskeletal: no joint deformities     Data Reviewed: I have personally reviewed following labs and imaging studies  CBC: Recent Labs  Lab 09/08/20 2023 09/08/20 2127 09/09/20 2224 09/10/20 0603  WBC 7.8  --  8.0 8.1  NEUTROABS 4.5  --   --   --   HGB 14.2 13.6 13.8 14.1  HCT 40.2 40.0 40.4 40.6  MCV 90.3  --  91.2 89.4  PLT 226  --  206 203   Basic Metabolic Panel: Recent Labs  Lab 09/08/20 2023 09/08/20 2127 09/09/20 2224 09/10/20 0603  NA 137 139  --  139  K 3.6 3.6  --  4.6  CL 102 103  --  103  CO2 23  --   --  24  GLUCOSE 94 89  --  158*  BUN 12 14  --  14  CREATININE 0.65 0.60 0.61 0.71  CALCIUM 9.7  --   --  10.0   GFR: Estimated Creatinine Clearance: 106.3 mL/min (by C-G formula based on SCr of 0.71 mg/dL). Liver Function Tests: Recent Labs  Lab 09/08/20 2023  AST 15  ALT 15  ALKPHOS 56  BILITOT 0.7  PROT 7.5  ALBUMIN 4.6   No results for input(s): LIPASE, AMYLASE in the last 168 hours. No results for input(s): AMMONIA in the last 168 hours. Coagulation Profile: Recent Labs  Lab 09/08/20 2023  INR 1.0   Cardiac Enzymes: No results for input(s): CKTOTAL, CKMB, CKMBINDEX, TROPONINI in the last 168 hours. BNP (last 3 results) No results for input(s): PROBNP in the last 8760 hours. HbA1C: Recent Labs    09/10/20 0603  HGBA1C 4.8   CBG: Recent Labs  Lab 09/09/20 2018 09/09/20 2204 09/09/20 2233 09/10/20 1600 09/11/20 0844  GLUCAP 66* 109* 85 205* 138*   Lipid Profile: No results for input(s): CHOL, HDL, LDLCALC, TRIG, CHOLHDL, LDLDIRECT in the last 72 hours. Thyroid Function Tests: No results for input(s): TSH, T4TOTAL, FREET4, T3FREE, THYROIDAB in the last 72 hours. Anemia Panel: No results for input(s):  VITAMINB12, FOLATE, FERRITIN, TIBC, IRON, RETICCTPCT in the last 72 hours.    Radiology Studies: I have reviewed all of the imaging during this hospital visit personally     Scheduled Meds: . enoxaparin (LOVENOX) injection  40 mg Subcutaneous Q24H  . insulin aspart  0-9 Units Subcutaneous TID WC  . pantoprazole  40 mg Oral Q1200   Continuous Infusions: . methylPREDNISolone (SOLU-MEDROL) injection 1,000 mg (09/11/20 0945)     LOS: 1 day        Aayla Marrocco Annett Gula, MD

## 2020-09-11 NOTE — Plan of Care (Signed)
  Problem: Education: Goal: Knowledge of General Education information will improve Description: Including pain rating scale, medication(s)/side effects and non-pharmacologic comfort measures Outcome: Progressing   Problem: Clinical Measurements: Goal: Ability to maintain clinical measurements within normal limits will improve Outcome: Progressing Goal: Will remain free from infection Outcome: Progressing   

## 2020-09-12 LAB — BASIC METABOLIC PANEL
Anion gap: 12 (ref 5–15)
BUN: 13 mg/dL (ref 6–20)
CO2: 20 mmol/L — ABNORMAL LOW (ref 22–32)
Calcium: 8.8 mg/dL — ABNORMAL LOW (ref 8.9–10.3)
Chloride: 106 mmol/L (ref 98–111)
Creatinine, Ser: 0.59 mg/dL (ref 0.44–1.00)
GFR, Estimated: >60 mL/min (ref >60)
Glucose, Bld: 143 mg/dL — ABNORMAL HIGH (ref 70–99)
Potassium: 3.9 mmol/L (ref 3.5–5.1)
Sodium: 138 mmol/L (ref 135–145)

## 2020-09-12 LAB — GLUCOSE, CAPILLARY
Glucose-Capillary: 113 mg/dL — ABNORMAL HIGH (ref 70–99)
Glucose-Capillary: 106 mg/dL — ABNORMAL HIGH (ref 70–99)
Glucose-Capillary: 216 mg/dL — ABNORMAL HIGH (ref 70–99)
Glucose-Capillary: 240 mg/dL — ABNORMAL HIGH (ref 70–99)

## 2020-09-12 LAB — HIV-1 RNA QUANT-NO REFLEX-BLD
HIV 1 RNA Quant: <20 copies/mL
LOG10 HIV-1 RNA: UNDETERMINED log10copy/mL

## 2020-09-12 MED ORDER — MELATONIN 3 MG PO TABS
6.0000 mg | ORAL_TABLET | Freq: Every evening | ORAL | Status: DC | PRN
  Administered 2020-09-12: 22:00:00 6 mg via ORAL
  Filled 2020-09-12: qty 2

## 2020-09-12 NOTE — Progress Notes (Signed)
HIV RNA quant negative. HIV 4th gen must be false pos EIA. Please call w further question

## 2020-09-12 NOTE — Plan of Care (Signed)

## 2020-09-12 NOTE — Evaluation (Signed)
Occupational Therapy Evaluation/Discharge Patient Details Name: Alice Travis MRN: [id number] DOB: March 03, 1986 Today's Date: 09/12/2020    History of Present Illness Alice Travis is a 34 y.o. female with a history of MS, migraine headache and retinal detachment, who recently moved to [place] from [place] without yet establishing contact with a new Neurologist, who presents to the ED with a 3-4 day history of right face, arm and leg weakness in conjunction with sensory numbness, unsteady gait and right sided headache. She also complains of bilateral floaters in her vision. She feels as though she is having an MS exacerbation.   Clinical Impression   PTA, pt lives with husband and young child. Pt Independent in all daily tasks, enjoys running. Pt presents now with weakness in R LE/UE though improving. Pt reports taking a shower without assistance prior to OT entry. Pt overall Independent for LB ADLs, bed mobility, transfers and long distance mobility in hallway. Pt motivated to return to running though understands slowly increasing activity tolerance. Provided UE HEP with light resistance bands, as well as putty and squeeze ball for hand strength. Pt reports family will be coming to stay with pt for >1 week. From functional standpoint, pt appears safe to DC home with support. No further skilled OT services indicated at this time. OT to sign off.     Follow Up Recommendations  No OT follow up;Supervision - Intermittent    Equipment Recommendations  None recommended by OT    Recommendations for Other Services       Precautions / Restrictions Precautions Precautions: Fall Precaution Comments: low fall Restrictions Weight Bearing Restrictions: No      Mobility Bed Mobility Overal bed mobility: Modified Independent                  Transfers Overall transfer level: Independent Equipment used: None             General transfer comment: Able to demo sit to stand  Independently without AD. Supervision for safety in mobility around unit without AD, no LOB noted.    Balance Overall balance assessment: No apparent balance deficits (not formally assessed)                                         ADL either performed or assessed with clinical judgement   ADL Overall ADL's : Modified independent                                       General ADL Comments: Pt overall Modified Independent for ADLs, able to use figure four position for socks (increased effort to bring R LE up due to weakness/stiffness but functional). Reports able to go to bathroom independently, had just showered with mild use of shower chair prior to OT entry     Vision Baseline Vision/History: Wears glasses Wears Glasses: At all times Patient Visual Report: No change from baseline Vision Assessment?: Vision impaired- to be further tested in functional context Additional Comments: hx of retinal detachment, floaters in vision. appears functional     Perception     Praxis      Pertinent Vitals/Pain Pain Assessment: No/denies pain     Hand Dominance  (both)   Extremity/Trunk Assessment Upper Extremity Assessment Upper Extremity Assessment: RUE deficits/detail RUE Deficits / Details: 4/5 shoulder, bicep,  tricep strength. grasp fair but weaker than L UE RUE Sensation: WNL RUE Coordination: WNL   Lower Extremity Assessment Lower Extremity Assessment: Defer to PT evaluation   Cervical / Trunk Assessment Cervical / Trunk Assessment: Normal   Communication Communication Communication: No difficulties   Cognition Arousal/Alertness: Awake/alert Behavior During Therapy: WFL for tasks assessed/performed Overall Cognitive Status: Within Functional Limits for tasks assessed                                     General Comments  Provided UE HEP using light resistance theraband, squeeze ball and putty to slowly increase strength and  activity tolerance after MS flare.    Exercises     Shoulder Instructions      Home Living Family/patient expects to be discharged to:: Private residence Living Arrangements: Spouse/significant other;Children (72 month old) Available Help at Discharge: Family;Available PRN/intermittently Type of Home: Apartment Home Access: Stairs to enter Entrance Stairs-Number of Steps: 3 flights Entrance Stairs-Rails: Right;Left Home Layout: One level     Bathroom Shower/Tub: Chief Strategy Officer: Standard     Home Equipment: Grab bars - tub/shower   Additional Comments: Pt lives in 3rd floor apartment. Husband works and pt is primary caregiver for 78 month old son. They have arranged for in-laws to stay/assist pt through new years. Pt has large tub shower with corners large enough to sit on      Prior Functioning/Environment Level of Independence: Independent        Comments: Typically very active, enjoys running. Has 74 month old son        OT Problem List: Decreased strength;Decreased activity tolerance      OT Treatment/Interventions:      OT Goals(Current goals can be found in the care plan section) Acute Rehab OT Goals Patient Stated Goal: be home for christmas OT Goal Formulation: All assessment and education complete, DC therapy  OT Frequency:     Barriers to D/C:            Co-evaluation              AM-PAC OT "6 Clicks" Daily Activity     Outcome Measure Help from another person eating meals?: None Help from another person taking care of personal grooming?: None Help from another person toileting, which includes using toliet, bedpan, or urinal?: None Help from another person bathing (including washing, rinsing, drying)?: None Help from another person to put on and taking off regular upper body clothing?: None Help from another person to put on and taking off regular lower body clothing?: None 6 Click Score: 24   End of Session     Activity Tolerance: Patient tolerated treatment well Patient left: in bed;with call bell/phone within reach  OT Visit Diagnosis: Muscle weakness (generalized) (M62.81)                Time: 2703-5009 OT Time Calculation (min): 28 min Charges:  OT General Charges $OT Visit: 1 Visit OT Evaluation $OT Eval Low Complexity: 1 Low OT Treatments $Therapeutic Activity: 8-22 mins  Lorre Munroe, OTR/L  Lorre Munroe 09/12/2020, 12:39 PM

## 2020-09-12 NOTE — Progress Notes (Addendum)
PROGRESS NOTE   Alice Travis  JJO:[id number]    DOB: [birth year]    DOA: 09/08/2020  PCP: Patient, No Pcp Per   I have briefly reviewed patients previous medical records in [facility].  Chief Complaint  Patient presents with   Loss of Consciousness    Brief Narrative:  34 year old married female, quite active (running etc.), history of multiple sclerosis, migraine headache and retinal detachment, who recently moved to [place] from [place] without yet establishing contact with a new neurologist, presented to the ED with 3 to 4-day history of right face, arm and leg weakness along with sensory numbness, unsteady gait and right-sided headache.  She also complained of bilateral floaters in her vision.  Neurology was consulted and she was admitted for suspected multiple sclerosis flare and was started on high-dose IV Solu-Medrol x5-day course.  Improving.  Assessment & Plan:  Principal Problem:   Multiple sclerosis exacerbation (HCC) Active Problems:   Multiple sclerosis (HCC)   Steroid-induced hyperglycemia   History of retinal detachment   Overweight (BMI 25.0-29.9)   Multiple sclerosis acute exacerbation  Presented with 3 to 4-day history of right face, arm and leg weakness in conjunction with sensory numbness, unsteady gait and right-sided headache.  She also complained of bilateral floaters in her vision.  Has been off disease modifying medication since moving from [place] to [place].  Per her report, had been on Copaxone but had recurrent exacerbations while on this  Rebif and Avonex were also tried but she had unacceptable side effects without good efficacy of these medications.  Most effective disease modifying medication she was on was Tecfidera.  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  She had vitrectomy performed 10/13/2019.  MRI brain in ED revealed multiple enhancing white matter lesions on a background of numerous T2  hyperintense chronic appearing MS lesions in the cerebral hemispheres as well as infratentorially.  MRI C-spine showed no acute enhancing lesions; subtle T2 hyperintensity seen within the cord at one location, appearing most consistent with chronic demyelinating lesion.  Neurology consulted on 12/17 when exam revealed right-sided weakness along with right-sided sensory deficit along with MRI findings suggesting MS flare-per neurology approximately 20-30 lesions within each cerebral hemisphere.  Neurology recommended IV methylprednisolone 1000 mg daily x5 days, daily CBC and BMP while on steroids, CBG checks for hyperglycemia, therapies evaluation, outpatient neurology follow-up with Dr. Laurita Quint in the MS clinic at [facility] and consider restarting her Tecfidera as outpatient.  Completed day 4/5 of IV methylprednisolone today.  Clinically much improved.  OT recommends no follow-up.  PT input pending.  I communicated with Neuro Hospitalist on call regarding need for steroids at discharge.  They will see patient formally in follow-up on 12/21.  They did indicate that they usually do not recommend an oral steroid taper but her scans look highly abnormal.  So in her case they suggest a 12-day oral prednisone taper (60 mg daily x2 days, then 50 mg daily x2 days, etc.).  Steroid-induced hyperglycemia:  Hemoglobin A1c 4.8.  Reasonably controlled on SSI, continue.  HIV screening test (for Stepan HIV fourth-generation assay) is positive  ID input 12/18 appreciated.  Discriminatory assay pending  HIV quantitative RNA pending.  Dr. Daiva Eves, ID will follow up tests and if patient has HIV with a formal consult.  Migraine headaches  None reported today.  History of retinal detachment  Outpatient follow-up with ophthalmology.  Body mass index is 28.25 kg/m.  DVT prophylaxis: enoxaparin (LOVENOX) injection 40 mg Start: 09/09/20 2145     Code Status: Full Code Family  Communication: None at bedside Disposition:  Status is: Inpatient   Dispo: The patient is from: Home              Anticipated d/c is to: Home              Anticipated d/c date is: 1 day.  Likely discharge home 12/21 after last dose of IV methylprednisolone.              Patient currently is not medically stable to d/c.        Consultants:   Neurology ID  Procedures:   None  Antimicrobials:    Anti-infectives (From admission, onward)   None        Subjective:  Overall patient feels much better.  States that she had some slurred speech and word finding difficulty PTA which is completely resolved.  Denies any numbness on right side.  Indicates that right extremity strength has significantly improved and almost improved by 80% now.  Able to eat and drink with her right hand which she was not able to do on admission.  Has been ambulating some.  Objective:   Vitals:   09/11/20 1540 09/11/20 2028 09/12/20 0404 09/12/20 0844  BP: 108/68 111/68 110/74 124/76  Pulse: 66 69 (!) 46 63  Resp: 20 17 15 20   Temp: 98.4 F (36.9 C) 98.9 F (37.2 C) (!) 97.5 F (36.4 C) 98 F (36.7 C)  TempSrc: Oral Oral Oral Oral  SpO2: 98% 95% 96% 99%  Weight:      Height:        General exam: Pleasant young female, moderately built and overweight sitting up comfortably in bed.  Was seen speaking to her husband on FaceTime.  Just finished eating her breakfast. Respiratory system: Clear to auscultation. Respiratory effort normal. Cardiovascular system: S1 & S2 heard, RRR. No JVD, murmurs, rubs, gallops or clicks. No pedal edema. Gastrointestinal system: Abdomen is nondistended, soft and nontender. No organomegaly or masses felt. Normal bowel sounds heard. Central nervous system: Alert and oriented. No focal neurological deficits. Extremities: Grade 5 x 5 power in left extremities and grade 4+ by 5 power in right extremities. Skin: No rashes, lesions or ulcers Psychiatry: Judgement and insight  appear normal. Mood & affect appropriate.     Data Reviewed:   I have personally reviewed following labs and imaging studies   CBC: Recent Labs  Lab 09/08/20 2023 09/08/20 2127 09/09/20 2224 09/10/20 0603  WBC 7.8  --  8.0 8.1  NEUTROABS 4.5  --   --   --   HGB 14.2 13.6 13.8 14.1  HCT 40.2 40.0 40.4 40.6  MCV 90.3  --  91.2 89.4  PLT 226  --  206 203    Basic Metabolic Panel: Recent Labs  Lab 09/08/20 2023 09/08/20 2127 09/09/20 2224 09/10/20 0603 09/12/20 0109  NA 137 139  --  139 138  K 3.6 3.6  --  4.6 3.9  CL 102 103  --  103 106  CO2 23  --   --  24 20*  GLUCOSE 94 89  --  158* 143*  BUN 12 14  --  14 13  CREATININE 0.65 0.60 0.61 0.71 0.59  CALCIUM 9.7  --   --  10.0 8.8*    Liver Function Tests: Recent Labs  Lab 09/08/20 2023  AST 15  ALT 15  ALKPHOS 56  BILITOT 0.7  PROT 7.5  ALBUMIN 4.6    CBG: Recent Labs  Lab 09/11/20 2032 09/12/20 0657 09/12/20 1215  GLUCAP 144* 113* 106*    Microbiology Studies:   Recent Results (from the past 240 hour(s))  Resp Panel by RT-PCR (Flu A&B, Covid) Nasopharyngeal Swab     Status: None   Collection Time: 09/09/20  8:50 PM   Specimen: Nasopharyngeal Swab; Nasopharyngeal(NP) swabs in vial transport medium  Result Value Ref Range Status   SARS Coronavirus 2 by RT PCR NEGATIVE NEGATIVE Final    Comment: (NOTE) SARS-CoV-2 target nucleic acids are NOT DETECTED.  The SARS-CoV-2 RNA is generally detectable in upper respiratory specimens during the acute phase of infection. The lowest concentration of SARS-CoV-2 viral copies this assay can detect is 138 copies/mL. A negative result does not preclude SARS-Cov-2 infection and should not be used as the sole basis for treatment or other patient management decisions. A negative result may occur with  improper specimen collection/handling, submission of specimen other than nasopharyngeal swab, presence of viral mutation(s) within the areas targeted by this  assay, and inadequate number of viral copies(<138 copies/mL). A negative result must be combined with clinical observations, patient history, and epidemiological information. The expected result is Negative.  Fact Sheet for Patients:  BloggerCourse.com  Fact Sheet for Healthcare Providers:  SeriousBroker.it  This test is no t yet approved or cleared by the [place] FDA and  has been authorized for detection and/or diagnosis of SARS-CoV-2 by FDA under an Emergency Use Authorization (EUA). This EUA will remain  in effect (meaning this test can be used) for the duration of the COVID-19 declaration under Section 564(b)(1) of the Act, 21 U.S.C.section 360bbb-3(b)(1), unless the authorization is terminated  or revoked sooner.       Influenza A by PCR NEGATIVE NEGATIVE Final   Influenza B by PCR NEGATIVE NEGATIVE Final    Comment: (NOTE) The Xpert Xpress SARS-CoV-2/FLU/RSV plus assay is intended as an aid in the diagnosis of influenza from Nasopharyngeal swab specimens and should not be used as a sole basis for treatment. Nasal washings and aspirates are unacceptable for Xpert Xpress SARS-CoV-2/FLU/RSV testing.  Fact Sheet for Patients: BloggerCourse.com  Fact Sheet for Healthcare Providers: SeriousBroker.it  This test is not yet approved or cleared by the [place] FDA and has been authorized for detection and/or diagnosis of SARS-CoV-2 by FDA under an Emergency Use Authorization (EUA). This EUA will remain in effect (meaning this test can be used) for the duration of the COVID-19 declaration under Section 564(b)(1) of the Act, 21 U.S.C. section 360bbb-3(b)(1), unless the authorization is terminated or revoked.  Performed at [facility], [address] [place], [place] [address]      Radiology Studies:  No results found.   Scheduled Meds:    enoxaparin  (LOVENOX) injection  40 mg Subcutaneous Q24H   insulin aspart  0-9 Units Subcutaneous TID WC   pantoprazole  40 mg Oral Q1200    Continuous Infusions:    methylPREDNISolone (SOLU-MEDROL) injection 1,000 mg (09/12/20 1224)     LOS: 2 days     Marcellus Scott, MD, [place], [facility]. [facility]    To contact the attending provider between 7A-7P or the covering provider during after hours 7P-7A, please log into the web site [email or web address] and access using universal [place] password for that web site. If you do not have the password, please call the hospital operator.  09/12/2020, 2:50 PM

## 2020-09-12 NOTE — Progress Notes (Signed)
Physical Therapy  Note  Clinical Impression:  PT evaluation complete with full note to follow;  Recommend Outpt Neuro PT follow up;   Van Clines, PT  Acute Rehabilitation Services Pager [phone] Office [phone]

## 2020-09-12 NOTE — Evaluation (Signed)
Physical Therapy Evaluation and Discharge Patient Details Name: Alice Travis MRN: [id number] DOB: 1986/09/14 Today's Date: 09/12/2020   History of Present Illness  Alice Travis is a 34 y.o. female with a history of MS, migraine headache and retinal detachment, who recently moved to [place] from [place] without yet establishing contact with a new Neurologist, who presents to the ED with a 3-4 day history of right face, arm and leg weakness in conjunction with sensory numbness, unsteady gait and right sided headache. She also complains of bilateral floaters in her vision. She feels as though she is having an MS exacerbation.  Clinical Impression   Patient evaluated by Physical Therapy with no further acute PT needs identified. All education has been completed and the patient has no further questions. Walked the hallway, and discussed follow up with an MS specialist and Outpt Neuro PT;  See below for any follow-up Physical Therapy or equipment needs. PT is signing off. Thank you for this referral.     Follow Up Recommendations Outpatient PT;Other (comment) (Rec Outpt Neuro Rehab)    Equipment Recommendations  None recommended by PT    Recommendations for Other Services       Precautions / Restrictions Precautions Precautions: None      Mobility  Bed Mobility Overal bed mobility: Independent                  Transfers Overall transfer level: Independent               General transfer comment: NO difficulty  Ambulation/Gait Ambulation/Gait assistance: Modified independent (Device/Increase time) Gait Distance (Feet): 600 Feet Assistive device: None Gait Pattern/deviations: Step-through pattern     General Gait Details: Overall walking without overt loss of balance; occasional R toe drag; notable slightly erratic step width with fatigue towards end of walk  Stairs Stairs: Yes Stairs assistance: Modified independent (Device/Increase time) Stair  Management: One rail Left;Step to pattern;Alternating pattern;Forwards;Backwards;Sideways Number of Stairs: 12 (and then reps on first single step for strengthening and control exercise) General stair comments: Managing well  Wheelchair Mobility    Modified Rankin (Stroke Patients Only)       Balance Overall balance assessment: Mild deficits observed, not formally tested                                           Pertinent Vitals/Pain Pain Assessment: No/denies pain    Home Living Family/patient expects to be discharged to:: Private residence Living Arrangements: Spouse/significant other;Children (67 month old) Available Help at Discharge: Family;Available PRN/intermittently Type of Home: Apartment Home Access: Stairs to enter Entrance Stairs-Rails: Right;Left Entrance Stairs-Number of Steps: 3 flights Home Layout: One level Home Equipment: Grab bars - tub/shower Additional Comments: Pt lives in 3rd floor apartment. Husband works and pt is primary caregiver for 94 month old son. They have arranged for in-laws to stay/assist pt through new years. Pt has large tub shower with corners large enough to sit on    Prior Function Level of Independence: Independent         Comments: Typically very active, enjoys running. Has 79 month old son     Hand Dominance        Extremity/Trunk Assessment   Upper Extremity Assessment Upper Extremity Assessment: Defer to OT evaluation    Lower Extremity Assessment Lower Extremity Assessment: RLE deficits/detail RLE Deficits / Details: Mild strength deficits noted functionally,  with occasional toe drag (wothout loss of balance), and tendency for knee to push back into hyperextension fo rstability       Communication   Communication: No difficulties  Cognition Arousal/Alertness: Awake/alert Behavior During Therapy: WFL for tasks assessed/performed Overall Cognitive Status: Within Functional Limits for tasks  assessed                                        General Comments      Exercises Other Exercises Other Exercises: Forwards and sideways step ups aimed at RLE strengtheing and knee control; cues to keep from popping back into hyperextension   Assessment/Plan    PT Assessment All further PT needs can be met in the next venue of care  PT Problem List Decreased strength;Decreased activity tolerance;Decreased balance;Decreased mobility;Decreased coordination;Decreased knowledge of use of DME;Decreased knowledge of precautions       PT Treatment Interventions      PT Goals (Current goals can be found in the Care Plan section)  Acute Rehab PT Goals Patient Stated Goal: be home for christmas PT Goal Formulation: All assessment and education complete, DC therapy    Frequency     Barriers to discharge        Co-evaluation               AM-PAC PT "6 Clicks" Mobility  Outcome Measure Help needed turning from your back to your side while in a flat bed without using bedrails?: None Help needed moving from lying on your back to sitting on the side of a flat bed without using bedrails?: None Help needed moving to and from a bed to a chair (including a wheelchair)?: None Help needed standing up from a chair using your arms (e.g., wheelchair or bedside chair)?: None Help needed to walk in hospital room?: None Help needed climbing 3-5 steps with a railing? : None 6 Click Score: 24    End of Session   Activity Tolerance: Patient tolerated treatment well Patient left: in bed;with call bell/phone within reach (managing independently managing in room) Nurse Communication: Mobility status PT Visit Diagnosis: Other abnormalities of gait and mobility (R26.89);Other symptoms and signs involving the nervous system (R29.898)    Time: 8001-2393 PT Time Calculation (min) (ACUTE ONLY): 21 min   Charges:   PT Evaluation $PT Eval Low Complexity: [place], PT  Acute Rehabilitation Services Pager [phone] Office [phone]   Colletta [place] 09/12/2020, 6:31 PM

## 2020-09-13 DIAGNOSIS — Z8669 Personal history of other diseases of the nervous system and sense organs: Secondary | ICD-10-CM

## 2020-09-13 DIAGNOSIS — R739 Hyperglycemia, unspecified: Secondary | ICD-10-CM

## 2020-09-13 DIAGNOSIS — E663 Overweight: Secondary | ICD-10-CM

## 2020-09-13 DIAGNOSIS — T380X5A Adverse effect of glucocorticoids and synthetic analogues, initial encounter: Secondary | ICD-10-CM

## 2020-09-13 DIAGNOSIS — G35 Multiple sclerosis: Principal | ICD-10-CM

## 2020-09-13 LAB — GLUCOSE, CAPILLARY
Glucose-Capillary: 129 mg/dL — ABNORMAL HIGH (ref 70–99)
Glucose-Capillary: 124 mg/dL — ABNORMAL HIGH (ref 70–99)

## 2020-09-13 MED ORDER — PREDNISONE 10 MG PO TABS: 60.0000 mg | ORAL_TABLET | Freq: Every day | ORAL | 0 refills | Status: DC

## 2020-09-13 NOTE — Discharge Summary (Signed)
Physician Discharge Summary  Annalaya Khouri OVZ:[id number] DOB: Sep 18, 1986 DOA: 09/08/2020  PCP: Alice Travis, No Pcp Per  Admit date: 09/08/2020 Discharge date: 09/13/2020  Admitted From: home Disposition:  home  Recommendations for Outpatient Follow-up:  1. Follow up with Neurology in 2 weeks  Home Health: none  Equipment/Devices: none   Discharge Condition: stable CODE STATUS: Full code Diet recommendation: regular  HPI: Per admitting MD, Alice Travis is a 34 y.o. female with history of multiple sclerosis and retinal detachment who has just recently moved from [place] with known history of multiple sclerosis not on any disease modifying medication has been experiencing right-sided upper and lower extremity and right facial numbness with weakness with right-sided headache for the last 4 days.  Has some difficulty with gait.  Denies any incontinence of urine or bowel and has been some floaters in both eyes.  Hospital Course / Discharge diagnoses: Principal problem Acute exacerbation, multiple sclerosis -neurology consulted and followed Alice Travis while hospitalized.  She presented with a history of a few days of arm and leg weakness especially on the right side.  She recently moved from [place], and has been treated in the past with Copaxone but had recurrent exacerbation, she was also tried on Rebif and Avonex with unacceptable side effects, currently on no disease modifying medications.  The most recent effective one was Tecfidera.  She also has a history of retinal detachment as well as prior optic neuritis and uveitis, had vitrectomy performed in 2021 January.  MRI of the brain showed multiple enhancing white matter lesions on a background of numerous T2 hyperintense chronic appearing MS lesions in the cerebral hemispheres as well as infratentorially. MRI C-spine showed no acute enhancing lesions; subtle T2 hyperintensity seen within the cord at one location, appearing most  consistent with chronic demyelinating lesion.  She was placed on high-dose methylprednisolone 1 g daily x5 days with significant improvement in her symptoms, now able to ambulate without difficulties.  Dr. Oncology discussed with neuro hospitalist on 12/20, in her case suggested a 12-day oral prednisone taper.  Ambulatory referral to neurology has been ordered  Active problems Steroid-induced hyperglycemia-A1c 4.8 Screening positive HIV test-false positive, HIV RNA quantitative negative History of migraine headaches -stable History of retinal detachment-outpatient follow-up with ophthalmology  Sepsis ruled out    Discharge Instructions  Discharge Instructions    Ambulatory referral to Neurology   Complete by: As directed    An appointment is requested in approximately: 2 weeks     Allergies as of 09/13/2020   No Known Allergies     Medication List    TAKE these medications   acetaminophen 500 MG tablet Commonly known as: TYLENOL Take 1,000 mg by mouth every 6 (six) hours as needed for mild pain or headache.   carboxymethylcellulose 0.5 % Soln Commonly known as: REFRESH PLUS Place 1 drop into both eyes 3 (three) times daily as needed (for dry eyes).   COLLAGEN PO Take 1 each by mouth in the morning.   multivitamin tablet Take 1 tablet by mouth daily.   predniSONE 10 MG tablet Commonly known as: DELTASONE Take 6 tablets (60 mg total) by mouth daily. 6 tablets x 2-day, 5 tablets x 2-day, so on decreased by 1 tablets every 2-day until finished       Follow-up Information    Sater, Pearletha Furl, MD. Schedule an appointment as soon as possible for a visit in 2 week(s).   Specialty: Neurology Contact information: [address] [place] [address]  [phone]               Consultations:  Neurology   Procedures/Studies:  MR Brain W and Wo Contrast  Result Date: 09/09/2020 CLINICAL DATA:  Multiple sclerosis.  New event. EXAM: MRI HEAD WITHOUT AND WITH  CONTRAST TECHNIQUE: Multiplanar, multiecho pulse sequences of the brain and surrounding structures were obtained without and with intravenous contrast. CONTRAST:  8mL GADAVIST GADOBUTROL 1 MMOL/ML IV SOLN COMPARISON:  None. FINDINGS: Brain: Diffusion imaging does not show any true restricted diffusion. Multiple areas of low level T2 shine through related to multiple MS plaques. There is a 12 mm MS lesion of the left pons that show some swelling and contrast enhancement consistent with an active lesion. There is an 8 mm lesion in the left middle cerebellar peduncle with peripheral enhancement. No other posterior fossa lesion. Right cerebral hemisphere shows approximately 20-30 demyelinating lesions scattered within the deep and subcortical white matter. Several show contrast enhancement. The left hemisphere likewise shows 20-30 demyelinating lesions, many more showing abnormal contrast enhancement. Moderate involvement of the corpus callosum. No sign of neoplastic mass lesion, hemorrhage, hydrocephalus or extra-axial collection. Vascular: Major vessels at the base of the brain show flow. Skull and upper cervical spine: Negative Sinuses/Orbits: Clear sinuses.  Previous scleral banding procedures. Other: None IMPRESSION: MS lesions distributed throughout the brain including the pons, left cerebellum and extensively throughout both cerebral hemispheres, many of which show abnormal contrast enhancement suggesting active nature. There are approximately 20-30 lesions within each cerebral hemisphere. Electronically Signed   By: Paulina Fusi M.D.   On: 09/09/2020 19:32   MR Cervical Spine W or Wo Contrast  Result Date: 09/09/2020 CLINICAL DATA:  Multiple sclerosis.  New clinical of vent. EXAM: MRI CERVICAL SPINE WITHOUT AND WITH CONTRAST TECHNIQUE: Multiplanar and multiecho pulse sequences of the cervical spine, to include the craniocervical junction and cervicothoracic junction, were obtained without and with  intravenous contrast. CONTRAST:  77mL GADAVIST GADOBUTROL 1 MMOL/ML IV SOLN COMPARISON:  None. FINDINGS: Alignment: Normal Vertebrae: Normal Cord: No convincing or definite MS lesion of the cervical spine. I question the presence of a subtle lesion on the right at the C4 level. No other area of concern. No abnormal contrast enhancement occurs there or elsewhere. Posterior Fossa, vertebral arteries, paraspinal tissues: See results of brain MRI. Disc levels: No significant degenerative changes. No stenosis of the canal or foramina. IMPRESSION: No convincing or definite MS lesion in the cervical spine. I question the presence of a subtle lesion on the right at the C4 level. No abnormal contrast enhancement. No other area of concern. Electronically Signed   By: Paulina Fusi M.D.   On: 09/09/2020 19:40      Subjective: - no chest pain, shortness of breath, no abdominal pain, nausea or vomiting.   Discharge Exam: BP (!) 128/95 (BP Location: Right Arm)   Pulse (!) 55   Temp 98.4 F (36.9 C) (Oral)   Resp 18   Ht 5\' 6"  (1.676 m)   Wt 79.4 kg   SpO2 98%   BMI 28.25 kg/m   General: Pt is alert, awake, not in acute distress Cardiovascular: RRR, S1/S2 +, no rubs, no gallops Respiratory: CTA bilaterally, no wheezing, no rhonchi Abdominal: Soft, NT, ND, bowel sounds + Extremities: no edema, no cyanosis   The results of significant diagnostics from this hospitalization (including imaging, microbiology, ancillary and laboratory) are listed below for reference.     Microbiology: Recent Results (from the past 240 hour(s))  Resp Panel by RT-PCR (Flu A&B, Covid) Nasopharyngeal Swab     Status: None   Collection Time: 09/09/20  8:50 PM   Specimen: Nasopharyngeal Swab; Nasopharyngeal(NP) swabs in vial transport medium  Result Value Ref Range Status   SARS Coronavirus 2 by RT PCR NEGATIVE NEGATIVE Final    Comment: (NOTE) SARS-CoV-2 target nucleic acids are NOT DETECTED.  The SARS-CoV-2 RNA is  generally detectable in upper respiratory specimens during the acute phase of infection. The lowest concentration of SARS-CoV-2 viral copies this assay can detect is 138 copies/mL. A negative result does not preclude SARS-Cov-2 infection and should not be used as the sole basis for treatment or other Alice Travis management decisions. A negative result may occur with  improper specimen collection/handling, submission of specimen other than nasopharyngeal swab, presence of viral mutation(s) within the areas targeted by this assay, and inadequate number of viral copies(<138 copies/mL). A negative result must be combined with clinical observations, Alice Travis history, and epidemiological information. The expected result is Negative.  Fact Sheet for Patients:  BloggerCourse.com  Fact Sheet for Healthcare Providers:  SeriousBroker.it  This test is no t yet approved or cleared by the [place] FDA and  has been authorized for detection and/or diagnosis of SARS-CoV-2 by FDA under an Emergency Use Authorization (EUA). This EUA will remain  in effect (meaning this test can be used) for the duration of the COVID-19 declaration under Section 564(b)(1) of the Act, 21 U.S.C.section 360bbb-3(b)(1), unless the authorization is terminated  or revoked sooner.       Influenza A by PCR NEGATIVE NEGATIVE Final   Influenza B by PCR NEGATIVE NEGATIVE Final    Comment: (NOTE) The Xpert Xpress SARS-CoV-2/FLU/RSV plus assay is intended as an aid in the diagnosis of influenza from Nasopharyngeal swab specimens and should not be used as a sole basis for treatment. Nasal washings and aspirates are unacceptable for Xpert Xpress SARS-CoV-2/FLU/RSV testing.  Fact Sheet for Patients: BloggerCourse.com  Fact Sheet for Healthcare Providers: SeriousBroker.it  This test is not yet approved or cleared by the [place] FDA and has been authorized for detection and/or diagnosis of SARS-CoV-2 by FDA under an Emergency Use Authorization (EUA). This EUA will remain in effect (meaning this test can be used) for the duration of the COVID-19 declaration under Section 564(b)(1) of the Act, 21 U.S.C. section 360bbb-3(b)(1), unless the authorization is terminated or revoked.  Performed at [facility], [address], [place], [place] [address]      Labs: Basic Metabolic Panel: Recent Labs  Lab 09/08/20 2023 09/08/20 2127 09/09/20 2224 09/10/20 0603 09/12/20 0109  NA 137 139  --  139 138  K 3.6 3.6  --  4.6 3.9  CL 102 103  --  103 106  CO2 23  --   --  24 20*  GLUCOSE 94 89  --  158* 143*  BUN 12 14  --  14 13  CREATININE 0.65 0.60 0.61 0.71 0.59  CALCIUM 9.7  --   --  10.0 8.8*   Liver Function Tests: Recent Labs  Lab 09/08/20 2023  AST 15  ALT 15  ALKPHOS 56  BILITOT 0.7  PROT 7.5  ALBUMIN 4.6   CBC: Recent Labs  Lab 09/08/20 2023 09/08/20 2127 09/09/20 2224 09/10/20 0603  WBC 7.8  --  8.0 8.1  NEUTROABS 4.5  --   --   --   HGB 14.2 13.6 13.8 14.1  HCT 40.2 40.0 40.4 40.6  MCV  90.3  --  91.2 89.4  PLT 226  --  206 203   CBG: Recent Labs  Lab 09/12/20 0657 09/12/20 1215 09/12/20 1611 09/12/20 2009 09/13/20 0635  GLUCAP 113* 106* 216* 240* 129*   Hgb A1c No results for input(s): HGBA1C in the last 72 hours. Lipid Profile No results for input(s): CHOL, HDL, LDLCALC, TRIG, CHOLHDL, LDLDIRECT in the last 72 hours. Thyroid function studies No results for input(s): TSH, T4TOTAL, T3FREE, THYROIDAB in the last 72 hours.  Invalid input(s): FREET3 Urinalysis No results found for: COLORURINE, APPEARANCEUR, LABSPEC, PHURINE, GLUCOSEU, HGBUR, BILIRUBINUR, KETONESUR, PROTEINUR, UROBILINOGEN, NITRITE, LEUKOCYTESUR  FURTHER DISCHARGE INSTRUCTIONS:   Get Medicines reviewed and adjusted: Please take all your medications with you for your next visit with your  Primary MD   Laboratory/radiological data: Please request your Primary MD to go over all hospital tests and procedure/radiological results at the follow up, please ask your Primary MD to get all Hospital records sent to his/her office.   In some cases, they will be blood work, cultures and biopsy results pending at the time of your discharge. Please request that your primary care M.D. goes through all the records of your hospital data and follows up on these results.   Also Note the following: If you experience worsening of your admission symptoms, develop shortness of breath, life threatening emergency, suicidal or homicidal thoughts you must seek medical attention immediately by calling 911 or calling your MD immediately  if symptoms less severe.   You must read complete instructions/literature along with all the possible adverse reactions/side effects for all the Medicines you take and that have been prescribed to you. Take any new Medicines after you have completely understood and accpet all the possible adverse reactions/side effects.    Do not drive when taking Pain medications or sleeping medications (Benzodaizepines)   Do not take more than prescribed Pain, Sleep and Anxiety Medications. It is not advisable to combine anxiety,sleep and pain medications without talking with your primary care practitioner   Special Instructions: If you have smoked or chewed Tobacco  in the last 2 yrs please stop smoking, stop any regular Alcohol  and or any Recreational drug use.   Wear Seat belts while driving.   Please note: You were cared for by a hospitalist during your hospital stay. Once you are discharged, your primary care physician will handle any further medical issues. Please note that NO REFILLS for any discharge medications will be authorized once you are discharged, as it is imperative that you return to your primary care physician (or establish a relationship with a primary care physician if  you do not have one) for your post hospital discharge needs so that they can reassess your need for medications and monitor your lab values.  Time coordinating discharge: 40 minutes  SIGNED:  Pamella Pert, MD, PhD 09/13/2020, 11:10 AM

## 2020-09-13 NOTE — TOC Initial Note (Signed)
Transition of Care [facility]) - Initial/Assessment Note    Patient Details  Name: Alice Travis MRN: [id number] Date of Birth: [birth year]  [facility] CM/SW Contact:    Lawerance Sabal, RN Phone Number: 09/13/2020, 11:14 AM  Clinical Narrative:                Sherron Monday w patient re DC plan. She agreeable and appreciative for referral to be placed to [facility] Neuro for OP PT, per PT eval recommendations. She states that she would like to schedule her own PCP care, and will call her insurance for providers. No other CM needs identified at this time.    Expected Discharge Plan: Home/Self Care Barriers to Discharge: Continued Medical Work up   Patient Goals and CMS Choice        Expected Discharge Plan and Services Expected Discharge Plan: Home/Self Care   Discharge Planning Services: CM Consult,Follow-up appt scheduled                     DME Arranged: N/A                    Prior Living Arrangements/Services                       Activities of Daily Living Home Assistive Devices/Equipment: None ADL Screening (condition at time of admission) Patient's cognitive ability adequate to safely complete daily activities?: Yes Is the patient deaf or have difficulty hearing?: No Does the patient have difficulty seeing, even when wearing glasses/contacts?: No Does the patient have difficulty concentrating, remembering, or making decisions?: No Patient able to express need for assistance with ADLs?: Yes Does the patient have difficulty dressing or bathing?: No Independently performs ADLs?: Yes (appropriate for developmental age) Does the patient have difficulty walking or climbing stairs?: No Weakness of Legs: Both Weakness of Arms/Hands: None  Permission Sought/Granted                  Emotional Assessment              Admission diagnosis:  Multiple sclerosis (HCC) [G35] Multiple sclerosis exacerbation (HCC) [G35] Syncope, unspecified syncope type  [R55] Patient Active Problem List   Diagnosis Date Noted  . Steroid-induced hyperglycemia 09/11/2020  . History of retinal detachment 09/11/2020  . Overweight (BMI 25.0-29.9) 09/11/2020  . Multiple sclerosis exacerbation (HCC) 09/09/2020  . Multiple sclerosis (HCC) 09/09/2020   PCP:  Patient, No Pcp Per Pharmacy:   [id number] - [place], [place] - [address] AT [facility] [address] [place] [address] Phone: [phone] Fax: [phone]     Social Determinants of Health (SDOH) Interventions    Readmission Risk Interventions No flowsheet data found.

## 2020-09-13 NOTE — Progress Notes (Signed)
Neurology Progress Note  CC: MS exacerbation  History is obtained from: the patient  HPI: Alice Travis is a 34 y.o. female who was diagnosed in 2011 with MS. She has had at least 6-8 exacerbations over this period of time, including two episodes of suspected optic neuritis. However, she's also had other ocular issues, such as retinal detachment. She underwent CSF examination in [place] and this is where she was initially diagnosed. The current admission was prompted by ataxia, slurred speech, numbness in the right face/arm, headache, floaters in her vision and right UE ataxia. All of these symptoms have improved s/p 5 days of high dose methyprednisolone. I reviewed all of her MRI images personally, and note multiple zones of contrast-enhancing lesions strongly suggestive of MS plaques. She has been on avonex, and tecfidera. Avonex caused depression, and tecfidera worked well though she had a little GI upset when it was first started. She also mentioned copaxone to Dr. Otelia Limes, but did not mention that one to me.  ROS: A 14 point ROS was performed and is negative except as noted in the HPI.  Past Medical History:  Diagnosis Date  . Migraines   . MS (multiple sclerosis) (HCC)   . Retinal detachment     Family History  Problem Relation Age of Onset  . Diabetes Mellitus II Father    Social History:  reports that she has quit smoking. She has never used smokeless tobacco. She reports previous alcohol use. She reports that she does not use drugs.   Exam: Current vital signs: BP (!) 128/95 (BP Location: Right Arm)   Pulse (!) 55   Temp 98.4 F (36.9 C) (Oral)   Resp 18   Ht 5\' 6"  (1.676 m)   Wt 79.4 kg   SpO2 98%   BMI 28.25 kg/m  Vital signs in last 24 hours: Temp:  [98 F (36.7 C)-98.4 F (36.9 C)] 98.4 F (36.9 C) (12/21 0834) Pulse Rate:  [55-67] 55 (12/21 0834) Resp:  [18] 18 (12/21 0404) BP: (117-128)/(74-95) 128/95 (12/21 0834) SpO2:  [98 %-99 %] 98 % (12/21  0834)   Physical Exam  Constitutional: Appears well-developed and well-nourished.  Psych: Affect appropriate to situation Eyes: No scleral injection HENT: No OP obstrucion MSK: no joint deformities.  Cardiovascular: Normal rate and regular rhythm.  Respiratory: Effort normal, non-labored breathing GI: Soft.  No distension. There is no tenderness.  Skin: WDI  Neuro: Mental Status: Patient is awake, alert, oriented to person, place, month, year, and situation. Patient is able to give a clear and coherent history. No signs of aphasia or neglect Cranial Nerves: II: Visual Fields are full. Pupils are equal, round, and reactive to light.  III,IV, VI: EOMI without ptosis or diploplia.  No INO. No nystagmus. V: Facial sensation is symmetric to temperature VII: Facial movement is symmetric.  VIII: hearing is intact to voice X: Uvula elevates symmetrically XI: Shoulder shrug is symmetric. XII: tongue is midline without atrophy or fasciculations.  Motor: Tone is normal. Bulk is normal. 5/5 strength was present in all four extremities, with the exception that she has a right upper extremity drift and subtle decrease in hand grip strength. Sensory: Sensation is symmetric to light touch and temperature in the arms and legs. Deep Tendon Reflexes: 2+ and symmetric in the biceps and patellae. Right Babinski sign present. Cerebellar: Marked dysmetria in the RUE on the FTN task, and slowing of RAM on that side as well. Her HKS maneuvers were OK, bilaterally, and the left  FTN, FO and both FNF tasks were all OK. Gait: Not assessed, but PT has given her clearance.  I have reviewed labs in epic and the results pertinent to this consultation are: unremarkable  I have reviewed the images obtained, as described above.  Impression: Most likely relapsing remitting multiple sclerosis. Cannot completely exclude NMOSD, but the prolonged course, good response to steroids, and lack of significant pathology  on spinal imaging are reassuring.  Recommendations: 1) I suggest that she establish care immediately to begin disease modifying therapy for her RRMS. Suggestions discussed include going back on Tecfidera, or perhaps Ocrevus. The latter would be my preference, given thye multiplicity of enhancing lesions on her imaging and numerous attacks that she's had.  Cleared for discharge. Outpatient follow up with neurology to be arranged.  Meredeth Ide, MD

## 2020-09-15 ENCOUNTER — Telehealth: Payer: Self-pay | Admitting: Neurology

## 2020-09-15 LAB — RNA QUALITATIVE: HIV 1 RNA Qualitative: 1

## 2020-09-15 LAB — HIV-1/2 AB - DIFFERENTIATION
HIV 1 Ab: NEGATIVE
HIV 2 Ab: NEGATIVE
Note: NEGATIVE

## 2020-09-15 NOTE — Telephone Encounter (Signed)
Pt. is requesting a refill for predniSONE (DELTASONE) 10 MG tablet.  Pharmacy: [id number] [facility] [place], [place]

## 2020-09-15 NOTE — Telephone Encounter (Signed)
This is a new patient that has never been seen by Dr Epimenio Foot. Has a pending appt on 10/05/20. Looks like ER prescribed Prednisone and it was printed on 09/13/20. I called the patient and LVM (did not mention any drug names or any medical conditions) and just advised that Dr Epimenio Foot cannot prescribed medication when he has not seen her yet. I advised the pt to go to the ER or urgent care and address the refill. She also can call back next week to see if we have any sooner openings but again we cannot prescribe medication ahead of time.

## 2020-09-21 ENCOUNTER — Ambulatory Visit: Payer: 59 | Attending: Internal Medicine | Admitting: Physical Therapy

## 2020-09-21 ENCOUNTER — Other Ambulatory Visit: Payer: Self-pay

## 2020-09-21 ENCOUNTER — Ambulatory Visit: Payer: 59 | Admitting: Physical Therapy

## 2020-09-21 ENCOUNTER — Encounter: Payer: Self-pay | Admitting: Physical Therapy

## 2020-09-21 DIAGNOSIS — R2689 Other abnormalities of gait and mobility: Secondary | ICD-10-CM | POA: Insufficient documentation

## 2020-09-21 DIAGNOSIS — M6281 Muscle weakness (generalized): Secondary | ICD-10-CM | POA: Insufficient documentation

## 2020-09-21 DIAGNOSIS — R262 Difficulty in walking, not elsewhere classified: Secondary | ICD-10-CM | POA: Insufficient documentation

## 2020-09-21 NOTE — Therapy (Signed)
[facility] [address] [place], [place], [address] Phone: [phone]   Fax:  [phone]  Physical Therapy Evaluation  Patient Details  Name: Alice Travis MRN: [id number] Date of Birth: August 22, 1986 Referring Provider (PT): Costin   Encounter Date: 09/21/2020   PT End of Session - 09/21/20 1741    Visit Number 1    Date for PT Re-Evaluation 11/21/20    PT Start Time 1655    PT Stop Time 1737    PT Time Calculation (min) 42 min    Activity Tolerance Patient tolerated treatment well    Behavior During Therapy [facility] for tasks assessed/performed           Past Medical History:  Diagnosis Date  . Migraines   . MS (multiple sclerosis) (HCC)   . Retinal detachment     Past Surgical History:  Procedure Laterality Date  . EYE SURGERY      There were no vitals filed for this visit.    Subjective Assessment - 09/21/20 1703    Subjective Patient reports that she has had MS diagnosis for 10 years.  She reports that she has had episodes about 1x/year a flare up.  She reports that this time it was significant and she went to the hospital, with weakness and difficulty walking.  She has a 86 month old at home,    Limitations Lifting;Walking;House hold activities    Patient Stated Goals return to working out, take care of my baby    Currently in Pain? No/denies              [facility] PT Assessment - 09/21/20 0001      Assessment   Medical Diagnosis weakness, difficulty walking, loss of balance    Referring Provider (PT) Costin    Onset Date/Surgical Date 09/08/20    Hand Dominance Right    Prior Therapy in hospital      Balance Screen   Has the patient fallen in the past 6 months Yes    How many times? 1    Has the patient had a decrease in activity level because of a fear of falling?  No    Is the patient reluctant to leave their home because of a fear of falling?  No      Home Environment   Additional Comments has  a 59 month old, housework, lives on 3rd floor      Prior Function   Level of Independence Independent    Vocation Unemployed    Leisure some tennis, running 1x/week, some weights at home      Posture/Postural Control   Posture Comments fwd head, rounded shoulders      ROM / Strength   AROM / PROM / Strength AROM;Strength      AROM   AROM Assessment Site Hip;Knee;Ankle      Strength   Overall Strength Comments right shoulder and elbow 4-/5    Strength Assessment Site Hip;Knee;Ankle    Right/Left Hip Right    Right Hip Flexion 3+/5    Right Hip ABduction 4-/5    Right/Left Knee Right    Right Knee Flexion 4/5    Right Knee Extension 4/5    Right/Left Ankle Right    Right Ankle Dorsiflexion 4-/5    Right Ankle Plantar Flexion 4-/5    Right Ankle Inversion 4-/5    Right Ankle Eversion 4-/5      Standardized Balance Assessment   Standardized Balance Assessment  Berg Balance Test                      Objective measurements completed on examination: See above findings.       [facility] Adult PT Treatment/Exercise - 09/21/20 0001      Ambulation/Gait   Gait Comments no device, reports feeling unsteady, she had difficulty with tandem walk and iwth SLS, some issues with dynamic surfaces.      High Level Balance   High Level Balance Comments unable to stand on right leg > 5 seconds, difficulty on airex balance      Exercises   Exercises Knee/Hip      Knee/Hip Exercises: Aerobic   Nustep level 5 x 5 minutes      Knee/Hip Exercises: Machines for Strengthening   Cybex Knee Extension 5# 2x10, this caused some pain in the right knee when we tried right only    Cybex Knee Flexion 15# right only 2x10                    PT Short Term Goals - 09/21/20 1747      PT SHORT TERM GOAL #1   Title issue HEP    Time 2    Period Weeks    Status New             PT Long Term Goals - 09/21/20 1747      PT LONG TERM GOAL #1   Title report no  difficulty with her stairs at home    Time 8    Period Weeks    Status New      PT LONG TERM GOAL #2   Title get up from floor with baby without hands and without struggling    Time 8    Period Weeks    Status New      PT LONG TERM GOAL #3   Title increase right LE strength to 4+/5    Time 8    Period Weeks    Status New      PT LONG TERM GOAL #4   Title be able to stand on the right LE 20 seconds    Time 8    Period Weeks    Status New                  Plan - 09/21/20 1742    Clinical Impression Statement Patient has had a dx of MS for about 11 years, she reports that she will normally have a flare up about 1x/year, she report that she was admitted to the hospital on 09/08/20 with a "severe flare up and one that was unlike the past", she reports difficulty walking, balance issues, weakness especially on the right side, she has good ROM, but her strength on the right is very limited with UE and LE.  She had difficulty with higher level balance activities, with exercises she had pain with right knee extnesion, she has a 67 month old, she reports difficulty opening jars and writing.    Stability/Clinical Decision Making Evolving/Moderate complexity    Clinical Decision Making Low    Rehab Potential Good    PT Frequency 1x / week    PT Duration 8 weeks    PT Treatment/Interventions ADLs/Self Care Home Management;Gait training;Stair training;Functional mobility training;Therapeutic activities;Therapeutic exercise;Balance training;Neuromuscular re-education;Manual techniques;Patient/family education    PT Next Visit Plan for PT start strength and functional training, I did send MD a note for a  referral to OT due to the reports of difficulty with opening jars and writing    Consulted and Agree with Plan of Care Patient           Patient will benefit from skilled therapeutic intervention in order to improve the following deficits and impairments:  Abnormal gait,Decreased  coordination,Difficulty walking,Cardiopulmonary status limiting activity,Decreased activity tolerance,Pain,Decreased balance,Decreased strength,Decreased mobility  Visit Diagnosis: Muscle weakness (generalized) - Plan: PT plan of care cert/re-cert  Difficulty in walking, not elsewhere classified - Plan: PT plan of care cert/re-cert  Balance problems - Plan: PT plan of care cert/re-cert     Problem List Patient Active Problem List   Diagnosis Date Noted  . Steroid-induced hyperglycemia 09/11/2020  . History of retinal detachment 09/11/2020  . Overweight (BMI 25.0-29.9) 09/11/2020  . Multiple sclerosis exacerbation (HCC) 09/09/2020  . Multiple sclerosis (HCC) 09/09/2020    Jearld Lesch., PT 09/21/2020, 5:52 PM  [facility] [address] [place], [place], [address] Phone: [phone]   Fax:  [phone]  Name: Alice Travis MRN: [id number] Date of Birth: 04/16/1986

## 2020-09-28 ENCOUNTER — Encounter: Payer: Self-pay | Admitting: Physical Therapy

## 2020-09-28 ENCOUNTER — Ambulatory Visit: Payer: 59 | Attending: Internal Medicine | Admitting: Physical Therapy

## 2020-09-28 ENCOUNTER — Other Ambulatory Visit: Payer: Self-pay

## 2020-09-28 ENCOUNTER — Ambulatory Visit: Payer: 59 | Admitting: Occupational Therapy

## 2020-09-28 DIAGNOSIS — R2689 Other abnormalities of gait and mobility: Secondary | ICD-10-CM

## 2020-09-28 DIAGNOSIS — R262 Difficulty in walking, not elsewhere classified: Secondary | ICD-10-CM | POA: Diagnosis present

## 2020-09-28 DIAGNOSIS — M6281 Muscle weakness (generalized): Secondary | ICD-10-CM

## 2020-09-28 NOTE — Therapy (Signed)
[facility] [address] [place], [place], [address] Phone: [phone]   Fax:  [phone]  Physical Therapy Treatment  Patient Details  Name: Alice Travis MRN: [id number] Date of Birth: 26-Mar-1986 Referring Provider (PT): Costin   Encounter Date: 09/28/2020   PT End of Session - 09/28/20 1341    Visit Number 2    Date for PT Re-Evaluation 11/21/20    PT Start Time 1300    PT Stop Time 1342    PT Time Calculation (min) 42 min    Activity Tolerance Patient tolerated treatment well    Behavior During Therapy [facility] for tasks assessed/performed           Past Medical History:  Diagnosis Date  . Migraines   . MS (multiple sclerosis) (HCC)   . Retinal detachment     Past Surgical History:  Procedure Laterality Date  . EYE SURGERY      There were no vitals filed for this visit.   Subjective Assessment - 09/28/20 1303    Subjective Feels like she is getting stronger, went to the gym at her apartment complex.    Currently in Pain? No/denies    Pain Location Knee    Pain Orientation Right                             [facility] Adult PT Treatment/Exercise - 09/28/20 0001      High Level Balance   High Level Balance Comments side step over half foam roll oto airex 2x5, side step and tandem walking on airex balance beam      Knee/Hip Exercises: Aerobic   Nustep level 5 x 5 minutes      Knee/Hip Exercises: Machines for Strengthening   Cybex Knee Extension 10lb 2x10, RLE 5lb x10 x5    Cybex Knee Flexion 25lb 2x10, RLE 15lb 2x10    Cybex Leg Press 20lb 2x10      Knee/Hip Exercises: Standing   Forward Step Up Both;2 sets;5 reps;Hand Hold: 0;Step Height: 6"    Walking with Sports Cord 30lb 4 way x 3 each                    PT Short Term Goals - 09/28/20 1344      PT SHORT TERM GOAL #1   Title issue HEP    Status Achieved             PT Long Term Goals - 09/21/20 1747      PT LONG  TERM GOAL #1   Title report no difficulty with her stairs at home    Time 8    Period Weeks    Status New      PT LONG TERM GOAL #2   Title get up from floor with baby without hands and without struggling    Time 8    Period Weeks    Status New      PT LONG TERM GOAL #3   Title increase right LE strength to 4+/5    Time 8    Period Weeks    Status New      PT LONG TERM GOAL #4   Title be able to stand on the right LE 20 seconds    Time 8    Period Weeks    Status New  Plan - 09/28/20 1341    Clinical Impression Statement Pt tolerated an initial progressing to TE well. Some initial instability with resisted gait but became more stable as reps progressed. Some R knee discomfort reported with step ups and SL knee extensions. Cues not to allow LE to snap into extensions and too control the eccentric phase of leg press.    Stability/Clinical Decision Making Evolving/Moderate complexity    Rehab Potential Good    PT Frequency 1x / week    PT Duration 8 weeks    PT Treatment/Interventions ADLs/Self Care Home Management;Gait training;Stair training;Functional mobility training;Therapeutic activities;Therapeutic exercise;Balance training;Neuromuscular re-education;Manual techniques;Patient/family education    PT Next Visit Plan for PT start strength and functional training           Patient will benefit from skilled therapeutic intervention in order to improve the following deficits and impairments:  Abnormal gait,Decreased coordination,Difficulty walking,Cardiopulmonary status limiting activity,Decreased activity tolerance,Pain,Decreased balance,Decreased strength,Decreased mobility  Visit Diagnosis: Balance problems  Difficulty in walking, not elsewhere classified  Muscle weakness (generalized)     Problem List Patient Active Problem List   Diagnosis Date Noted  . Steroid-induced hyperglycemia 09/11/2020  . History of retinal detachment 09/11/2020   . Overweight (BMI 25.0-29.9) 09/11/2020  . Multiple sclerosis exacerbation (HCC) 09/09/2020  . Multiple sclerosis (HCC) 09/09/2020    Grayce Sessions, PTA 09/28/2020, 1:44 PM  [facility] [address] [place], [place], [address] Phone: [phone]   Fax:  [phone]  Name: Alice Travis MRN: [id number] Date of Birth: 1986/06/27

## 2020-10-05 ENCOUNTER — Encounter: Payer: Self-pay | Admitting: Neurology

## 2020-10-05 ENCOUNTER — Ambulatory Visit: Payer: 59 | Admitting: Neurology

## 2020-12-08 ENCOUNTER — Emergency Department (HOSPITAL_COMMUNITY)
Admission: EM | Admit: 2020-12-08 | Discharge: 2020-12-08 | Disposition: A | Discharge status: Home / Self Care | Payer: 59 | Attending: Emergency Medicine | Admitting: Emergency Medicine

## 2020-12-08 ENCOUNTER — Encounter (HOSPITAL_COMMUNITY): Payer: Self-pay

## 2020-12-08 DIAGNOSIS — Z87891 Personal history of nicotine dependence: Secondary | ICD-10-CM | POA: Insufficient documentation

## 2020-12-08 DIAGNOSIS — H547 Unspecified visual loss: Secondary | ICD-10-CM | POA: Insufficient documentation

## 2020-12-08 DIAGNOSIS — H5462 Unqualified visual loss, left eye, normal vision right eye: Secondary | ICD-10-CM

## 2020-12-08 LAB — COMPREHENSIVE METABOLIC PANEL
ALT: 53 U/L — ABNORMAL HIGH (ref 0–44)
AST: 29 U/L (ref 15–41)
Albumin: 5.3 g/dL — ABNORMAL HIGH (ref 3.5–5.0)
Alkaline Phosphatase: 50 U/L (ref 38–126)
Anion gap: 9 (ref 5–15)
BUN: 7 mg/dL (ref 6–20)
CO2: 22 mmol/L (ref 22–32)
Calcium: 9.3 mg/dL (ref 8.9–10.3)
Chloride: 108 mmol/L (ref 98–111)
Creatinine, Ser: 0.55 mg/dL (ref 0.44–1.00)
GFR, Estimated: >60 mL/min (ref >60)
Glucose, Bld: 104 mg/dL — ABNORMAL HIGH (ref 70–99)
Potassium: 3.8 mmol/L (ref 3.5–5.1)
Sodium: 139 mmol/L (ref 135–145)
Total Bilirubin: 0.8 mg/dL (ref 0.3–1.2)
Total Protein: 7 g/dL (ref 6.5–8.1)

## 2020-12-08 LAB — CBC
HCT: 39 % (ref 36.0–46.0)
Hemoglobin: 14.1 g/dL (ref 12.0–15.0)
MCH: 32.2 pg (ref 26.0–34.0)
MCHC: 36.2 g/dL — ABNORMAL HIGH (ref 30.0–36.0)
MCV: 89 fL (ref 80.0–100.0)
Platelets: 192 10*3/uL (ref 150–400)
RBC: 4.38 MIL/uL (ref 3.87–5.11)
RDW: 12 % (ref 11.5–15.5)
WBC: 6.1 10*3/uL (ref 4.0–10.5)
nRBC: 0 % (ref 0.0–0.2)

## 2020-12-08 LAB — I-STAT BETA HCG BLOOD, ED (MC, WL, AP ONLY): I-stat hCG, quantitative: <5 m[IU]/mL (ref <5)

## 2020-12-08 MED ORDER — FLUORESCEIN SODIUM 1 MG OP STRP
1.0000 | ORAL_STRIP | Freq: Once | OPHTHALMIC | Status: AC
  Administered 2020-12-08: 1 via OPHTHALMIC
  Filled 2020-12-08: qty 1

## 2020-12-08 MED ORDER — TETRACAINE HCL 0.5 % OP SOLN
2.0000 [drp] | Freq: Once | OPHTHALMIC | Status: AC
  Administered 2020-12-08: 2 [drp] via OPHTHALMIC
  Filled 2020-12-08: qty 4

## 2020-12-08 NOTE — ED Provider Notes (Signed)
[facility] EMERGENCY DEPARTMENT Provider Note   CSN: [id number] Arrival date & time: 12/08/20  1219     History Chief Complaint  Patient presents with  . Loss of Vision    Alice Travis is a 35 y.o. female with PMH/o MS, retinal detachment (bilatearlly) who presents for evaluation of vision loss.  She reports that yesterday, she started noticing some vision changes in her left eye.  She noted some blurriness.  She reports that today it got worse until she was having near complete vision loss.  She states that she can make out light and the outline of things but states she cannot see out of her left eye.  She states that the bottom there is some black that is floating back-and-forth.  She states that she has had some associated headache.  He has had a history of retinal detachments before in both eyes.  Her last one was in February 2021.  She denies any trauma, injury to the eye.  She states that she has not had any fevers.  She has a history of MS and does not feel like she has been in a flare but she has had some optic neuritis before and is found to have flares.  She denies any numbness/weakness.  The history is provided by the patient.       Past Medical History:  Diagnosis Date  . Migraines   . MS (multiple sclerosis) (HCC)   . Retinal detachment     Patient Active Problem List   Diagnosis Date Noted  . Steroid-induced hyperglycemia 09/11/2020  . History of retinal detachment 09/11/2020  . Overweight (BMI 25.0-29.9) 09/11/2020  . Multiple sclerosis exacerbation (HCC) 09/09/2020  . Multiple sclerosis (HCC) 09/09/2020    Past Surgical History:  Procedure Laterality Date  . EYE SURGERY       OB History   No obstetric history on file.     Family History  Problem Relation Age of Onset  . Diabetes Mellitus II Father     Social History   Tobacco Use  . Smoking status: Former Games developer  . Smokeless tobacco: Never Used  Substance Use Topics  .  Alcohol use: Not Currently  . Drug use: Never    Home Medications Prior to Admission medications   Medication Sig Start Date End Date Taking? Authorizing Provider  acetaminophen (TYLENOL) 500 MG tablet Take 500 mg by mouth 2 (two) times daily as needed for mild pain or headache.   Yes [provider]  carboxymethylcellulose (REFRESH PLUS) 0.5 % SOLN Place 1 drop into both eyes 3 (three) times daily as needed (for dry eyes).   Yes [provider]  Multiple Vitamin (MULTIVITAMIN) tablet Take 1 tablet by mouth daily.   Yes [provider]  COLLAGEN PO Take 1 each by mouth in the morning. Patient not taking: Reported on 12/08/2020    [provider]  predniSONE (DELTASONE) 10 MG tablet Take 6 tablets (60 mg total) by mouth daily. 6 tablets x 2-day, 5 tablets x 2-day, so on decreased by 1 tablets every 2-day until finished Patient not taking: Reported on 12/08/2020 09/13/20   Leatha Gilding, MD    Allergies    Patient has no known allergies.  Review of Systems   Review of Systems  Eyes: Positive for visual disturbance. Negative for photophobia.  All other systems reviewed and are negative.   Physical Exam Updated Vital Signs BP 111/77   Pulse 63   Temp 98.2  F (36.8 C)   Resp 20   LMP 12/07/2020   SpO2 99%   Physical Exam Vitals and nursing note reviewed.  Constitutional:      Appearance: She is well-developed.  HENT:     Head: Normocephalic and atraumatic.  Eyes:     General: No scleral icterus.       Right eye: No discharge.        Left eye: No discharge.     Conjunctiva/sclera: Conjunctivae normal.     Comments: Absent red reflex in left eye. Both pupils react and constrict to light. EOMS.  She can clearly identify how many fingers I am holding up with her right eye but cannot identify anything with her left eye.  Pulmonary:     Effort: Pulmonary effort is normal.  Skin:    General: Skin is warm and dry.  Neurological:     Mental  Status: She is alert.     Comments: Follows commands, Moves all extremities  5/5 strength to BUE and BLE  Sensation intact throughout all major nerve distributions  Psychiatric:        Speech: Speech normal.        Behavior: Behavior normal.     ED Results / Procedures / Treatments   Labs (all labs ordered are listed, but only abnormal results are displayed) Labs Reviewed  CBC - Abnormal; Notable for the following components:      Result Value   MCHC 36.2 (*)    All other components within normal limits  COMPREHENSIVE METABOLIC PANEL - Abnormal; Notable for the following components:   Glucose, Bld 104 (*)    Albumin 5.3 (*)    ALT 53 (*)    All other components within normal limits  I-STAT BETA HCG BLOOD, ED ([facility], [facility], [facility] ONLY)    EKG None  Radiology No results found.  Procedures Procedures   Medications Ordered in ED Medications  tetracaine (PONTOCAINE) 0.5 % ophthalmic solution 2 drop (2 drops Both Eyes Given 12/08/20 1350)  fluorescein ophthalmic strip 1 strip (1 strip Both Eyes Given 12/08/20 1350)    ED Course  I have reviewed the triage vital signs and the nursing notes.  Pertinent labs & imaging results that were available during my care of the patient were reviewed by me and considered in my medical decision making (see chart for details).    MDM Rules/Calculators/A&P                          35 year old female possible history of MS, retinal detachment who presents for evaluation of vision loss in her left eye.  She reports that she started noticing some blurry vision yesterday and then it progressed to vision loss in the left eye.  She reports no trauma to the eye.  She has a history of retinal detachments previously.  Last one was in February 2021.  On initial arrival, she is afebrile, toxic appearing.  Vital signs are stable.  She has no obvious weakness to her arms or legs.  She does states that sometimes she gets flares and has had optic neuritis.  She  states she does not feel like she is in a flare and does not have any numbness or weakness of her arms or legs.  On my evaluation, pupils reactive and constrict to light but she does not have any red reflex and she has difficulty seeing out of her left eye.  EOMs are intact.  Differential includes retinal detachment versus vitreous hemorrhage.  Also consider optic neuritis but patient is not having any other symptoms.  She has seen Dr. Hanley Seamen [facility]) in the past.  Will discuss with him regarding further recommendation.  Ophthalmology ultrasound as documented below.  She did have some areas that appear to be some debris/floating material.  Question of this is retinal detachment versus vitreous hemorrhage.  Discussed patient with Dr. Dione Booze (ophthalmology).  He recommends that patient be seen in the office immediately for further evaluation of potential detachment versus hemorrhage.  We did discuss the possibility of optic neuritis but told him that patient is not having any numbness/weakness and does not feel like she is in a flare.  We we will plan to discharge her to the eye office for him to evaluate.  I discussed this with patient.  At this time, she is not having any numbness/weakness of arms or legs.  I discussed with her that if the ophthalmology evaluation does not reveal a source, we can reevaluate her in the ED for evaluation of possible optic neuritis.  Patient is agreeable to plan.  We will plan to send her over to Dr. Cammie Mcgee office. Discussed patient with Dr. Donnald Garre who is agreeable.          Visual Acuity  Right Eye Distance: 20/125 Left Eye Distance: 0 Bilateral Distance:    Right Eye Near: R Near: 10/63 Left Eye Near:  L Near: 0 Bilateral Near:     Intraocular pressure as documented below:  Left IOP: 18 Right IOP: 20  EMERGENCY DEPARTMENT [place] OCULAR EXAM "Study: Limited Ultrasound of Orbit "  INDICATIONS: Vision loss Linear probe utilized to obtain  images in both long and short axis of the orbit having the patient look left and right if possible.  PERFORMED BY: Myself IMAGES ARCHIVED?: Yes LIMITATIONS: none VIEWS USED: Left orbit INTERPRETATION: Questionable partial detachment vs hemorrhage    Final Clinical Impression(s) / ED Diagnoses Final diagnoses:  Vision loss of left eye    Rx / DC Orders ED Discharge Orders    None       Rosana Hoes 12/08/20 1449    Arby Barrette, MD 12/09/20 [phone]

## 2020-12-08 NOTE — Discharge Instructions (Signed)
Go directly to the [facility] where they will see you.   [address] [place], [place] [address]

## 2020-12-08 NOTE — ED Triage Notes (Signed)
Pt here today due to headache and complete vision loss in left eye. PT has h/o retinal detachment and MS.

## 2020-12-12 ENCOUNTER — Ambulatory Visit (HOSPITAL_COMMUNITY): Payer: Self-pay | Admitting: Anesthesiology

## 2020-12-12 ENCOUNTER — Observation Stay (HOSPITAL_COMMUNITY)
Admission: RE | Admit: 2020-12-12 | Discharge: 2020-12-13 | Disposition: A | Discharge status: Home / Self Care | Payer: Self-pay | Source: Ambulatory Visit | Attending: Ophthalmology | Admitting: Ophthalmology

## 2020-12-12 ENCOUNTER — Encounter (HOSPITAL_COMMUNITY): Payer: Self-pay | Admitting: Ophthalmology

## 2020-12-12 ENCOUNTER — Other Ambulatory Visit: Payer: Self-pay

## 2020-12-12 ENCOUNTER — Encounter (HOSPITAL_COMMUNITY)
Admission: RE | Disposition: A | Discharge status: Home / Self Care | Payer: Self-pay | Source: Ambulatory Visit | Attending: Ophthalmology

## 2020-12-12 DIAGNOSIS — Z20822 Contact with and (suspected) exposure to covid-19: Secondary | ICD-10-CM | POA: Insufficient documentation

## 2020-12-12 DIAGNOSIS — Z4889 Encounter for other specified surgical aftercare: Secondary | ICD-10-CM

## 2020-12-12 DIAGNOSIS — Z9889 Other specified postprocedural states: Secondary | ICD-10-CM

## 2020-12-12 DIAGNOSIS — H33022 Retinal detachment with multiple breaks, left eye: Principal | ICD-10-CM | POA: Insufficient documentation

## 2020-12-12 HISTORY — PX: REPAIR OF COMPLEX TRACTION RETINAL DETACHMENT: SHX6217

## 2020-12-12 LAB — SARS CORONAVIRUS 2 BY RT PCR (HOSPITAL ORDER, PERFORMED IN ~~LOC~~ HOSPITAL LAB): SARS Coronavirus 2: NEGATIVE

## 2020-12-12 LAB — POCT PREGNANCY, URINE: Preg Test, Ur: NEGATIVE

## 2020-12-12 SURGERY — REPAIR, RETINAL DETACHMENT, COMPLEX
Anesthesia: General | Site: Eye | Laterality: Left

## 2020-12-12 MED ORDER — FENTANYL CITRATE (PF) 250 MCG/5ML IJ SOLN
INTRAMUSCULAR | Status: AC
  Filled 2020-12-12: qty 5

## 2020-12-12 MED ORDER — INDOCYANINE GREEN 25 MG IV SOLR
INTRAVENOUS | Status: AC
  Filled 2020-12-12: qty 10

## 2020-12-12 MED ORDER — HYALURONIDASE HUMAN 150 UNIT/ML IJ SOLN
INTRAMUSCULAR | Status: AC
  Filled 2020-12-12: qty 1

## 2020-12-12 MED ORDER — BSS IO SOLN
INTRAOCULAR | Status: AC
  Filled 2020-12-12: qty 15

## 2020-12-12 MED ORDER — PHENYLEPHRINE HCL (PRESSORS) 10 MG/ML IV SOLN
INTRAVENOUS | Status: DC | PRN
  Administered 2020-12-12: 120 ug via INTRAVENOUS

## 2020-12-12 MED ORDER — PROPOFOL 10 MG/ML IV BOLUS
INTRAVENOUS | Status: DC | PRN
  Administered 2020-12-12: 200 mg via INTRAVENOUS
  Administered 2020-12-12: 30 mg via INTRAVENOUS

## 2020-12-12 MED ORDER — ONDANSETRON HCL 4 MG/2ML IJ SOLN: INTRAMUSCULAR | Status: DC | PRN

## 2020-12-12 MED ORDER — ONDANSETRON HCL 4 MG/2ML IJ SOLN
INTRAMUSCULAR | Status: DC | PRN
  Administered 2020-12-12: 4 mg via INTRAVENOUS

## 2020-12-12 MED ORDER — MIDAZOLAM HCL 2 MG/2ML IJ SOLN
INTRAMUSCULAR | Status: AC
  Filled 2020-12-12: qty 2

## 2020-12-12 MED ORDER — LIDOCAINE HCL 2 % IJ SOLN
INTRAMUSCULAR | Status: AC
  Filled 2020-12-12: qty 20

## 2020-12-12 MED ORDER — HYPROMELLOSE (GONIOSCOPIC) 2.5 % OP SOLN
OPHTHALMIC | Status: AC
  Filled 2020-12-12: qty 15

## 2020-12-12 MED ORDER — TOBRAMYCIN-DEXAMETHASONE 0.3-0.1 % OP OINT
TOPICAL_OINTMENT | OPHTHALMIC | Status: AC
  Filled 2020-12-12: qty 3.5

## 2020-12-12 MED ORDER — EPHEDRINE SULFATE 50 MG/ML IJ SOLN
INTRAMUSCULAR | Status: DC | PRN
  Administered 2020-12-12: 5 mg via INTRAVENOUS

## 2020-12-12 MED ORDER — ACETAMINOPHEN 500 MG PO TABS
ORAL_TABLET | ORAL | Status: AC
  Filled 2020-12-12: qty 2

## 2020-12-12 MED ORDER — ATROPINE SULFATE 1 % OP SOLN
OPHTHALMIC | Status: AC
  Filled 2020-12-12: qty 5

## 2020-12-12 MED ORDER — PHENYLEPHRINE HCL-NACL 10-0.9 MG/250ML-% IV SOLN
INTRAVENOUS | Status: DC | PRN
  Administered 2020-12-12: 25 ug/min via INTRAVENOUS

## 2020-12-12 MED ORDER — HYPROMELLOSE (GONIOSCOPIC) 2.5 % OP SOLN
OPHTHALMIC | Status: DC | PRN
  Administered 2020-12-12: 2 [drp] via OPHTHALMIC

## 2020-12-12 MED ORDER — DEXAMETHASONE SODIUM PHOSPHATE 10 MG/ML IJ SOLN
INTRAMUSCULAR | Status: AC
  Filled 2020-12-12: qty 1

## 2020-12-12 MED ORDER — PHENYLEPHRINE HCL 2.5 % OP SOLN
1.0000 [drp] | OPHTHALMIC | Status: AC | PRN
  Administered 2020-12-12 (×3): 1 [drp] via OPHTHALMIC
  Filled 2020-12-12: qty 2

## 2020-12-12 MED ORDER — PROPOFOL 10 MG/ML IV BOLUS
INTRAVENOUS | Status: AC
  Filled 2020-12-12: qty 20

## 2020-12-12 MED ORDER — ORAL CARE MOUTH RINSE: 15.0000 mL | Freq: Once | OROMUCOSAL | Status: AC

## 2020-12-12 MED ORDER — CYCLOPENTOLATE HCL 1 % OP SOLN
1.0000 [drp] | OPHTHALMIC | Status: AC | PRN
  Administered 2020-12-12 (×3): 1 [drp] via OPHTHALMIC
  Filled 2020-12-12: qty 2

## 2020-12-12 MED ORDER — SODIUM CHLORIDE 0.9 % IV SOLN: INTRAVENOUS | Status: DC | PRN

## 2020-12-12 MED ORDER — BUPIVACAINE HCL (PF) 0.75 % IJ SOLN
INTRAMUSCULAR | Status: AC
  Filled 2020-12-12: qty 10

## 2020-12-12 MED ORDER — CEFAZOLIN SUBCONJUNCTIVAL INJECTION 100 MG/0.5 ML
100.0000 mg | INJECTION | SUBCONJUNCTIVAL | Status: AC
  Administered 2020-12-12: 100 mg via SUBCONJUNCTIVAL
  Filled 2020-12-12: qty 1

## 2020-12-12 MED ORDER — OFLOXACIN 0.3 % OP SOLN
1.0000 [drp] | OPHTHALMIC | Status: AC | PRN
  Administered 2020-12-12 (×3): 1 [drp] via OPHTHALMIC
  Filled 2020-12-12: qty 5

## 2020-12-12 MED ORDER — ACETAMINOPHEN 325 MG PO TABS
325.0000 mg | ORAL_TABLET | ORAL | Status: DC | PRN
  Administered 2020-12-12: 650 mg via ORAL
  Filled 2020-12-12: qty 2

## 2020-12-12 MED ORDER — DEXAMETHASONE SODIUM PHOSPHATE 4 MG/ML IJ SOLN
INTRAMUSCULAR | Status: DC | PRN
  Administered 2020-12-12: 4 mg via INTRAVENOUS

## 2020-12-12 MED ORDER — LIDOCAINE 2% (20 MG/ML) 5 ML SYRINGE
INTRAMUSCULAR | Status: AC
  Filled 2020-12-12: qty 5

## 2020-12-12 MED ORDER — CHLORHEXIDINE GLUCONATE 0.12 % MT SOLN
15.0000 mL | Freq: Once | OROMUCOSAL | Status: AC
  Administered 2020-12-12: 15 mL via OROMUCOSAL
  Filled 2020-12-12: qty 15

## 2020-12-12 MED ORDER — LIDOCAINE HCL 2 % IJ SOLN
INTRAMUSCULAR | Status: DC | PRN
  Administered 2020-12-12: 2 mL via RETROBULBAR

## 2020-12-12 MED ORDER — TOBRAMYCIN-DEXAMETHASONE 0.3-0.1 % OP OINT
TOPICAL_OINTMENT | OPHTHALMIC | Status: DC | PRN
  Administered 2020-12-12: 1 via OPHTHALMIC

## 2020-12-12 MED ORDER — ACETAMINOPHEN 500 MG PO TABS
1000.0000 mg | ORAL_TABLET | Freq: Once | ORAL | Status: AC
  Administered 2020-12-12: 1000 mg via ORAL
  Filled 2020-12-12: qty 2

## 2020-12-12 MED ORDER — MIDAZOLAM HCL 5 MG/5ML IJ SOLN
INTRAMUSCULAR | Status: DC | PRN
  Administered 2020-12-12: 2 mg via INTRAVENOUS

## 2020-12-12 MED ORDER — BSS IO SOLN
INTRAOCULAR | Status: DC | PRN
  Administered 2020-12-12 (×2): 15 mL

## 2020-12-12 MED ORDER — EPINEPHRINE PF 1 MG/ML IJ SOLN
INTRAMUSCULAR | Status: AC
  Filled 2020-12-12: qty 1

## 2020-12-12 MED ORDER — BSS PLUS IO SOLN
INTRAOCULAR | Status: AC
  Filled 2020-12-12: qty 500

## 2020-12-12 MED ORDER — FENTANYL CITRATE (PF) 100 MCG/2ML IJ SOLN
INTRAMUSCULAR | Status: DC | PRN
  Administered 2020-12-12 (×2): 50 ug via INTRAVENOUS
  Administered 2020-12-12: 25 ug via INTRAVENOUS

## 2020-12-12 MED ORDER — SODIUM CHLORIDE 0.9 % IV SOLN: INTRAVENOUS | Status: DC

## 2020-12-12 MED ORDER — EPINEPHRINE PF 1 MG/ML IJ SOLN: INTRAOCULAR | Status: DC | PRN

## 2020-12-12 MED ORDER — DEXAMETHASONE SODIUM PHOSPHATE 10 MG/ML IJ SOLN
INTRAMUSCULAR | Status: DC | PRN
  Administered 2020-12-12: 10 mg

## 2020-12-12 SURGICAL SUPPLY — 40 items
APPLICATOR COTTON TIP 6 STRL (MISCELLANEOUS) ×1 IMPLANT
APPLICATOR COTTON TIP 6IN STRL (MISCELLANEOUS) ×2
BAND WRIST GAS GREEN (MISCELLANEOUS) IMPLANT
BLADE MVR KNIFE 20G (BLADE) ×2 IMPLANT
BLADE STAB KNIFE 15DEG (BLADE) IMPLANT
BNDG EYE OVAL (GAUZE/BANDAGES/DRESSINGS) ×2 IMPLANT
CABLE BIPOLOR RESECTION CORD (MISCELLANEOUS) ×2 IMPLANT
CANNULA ANT CHAM MAIN (OPHTHALMIC RELATED) ×2 IMPLANT
DRAPE RETRACTOR (MISCELLANEOUS) IMPLANT
FORCEPS ECKARDT ILM 25G SERR (OPHTHALMIC RELATED) IMPLANT
GAS AUTO FILL CONSTEL (OPHTHALMIC) ×2
GAS AUTO FILL CONSTELLATION (OPHTHALMIC) ×1 IMPLANT
GAS WRIST BAND GREEN (MISCELLANEOUS)
GLOVE SURG SYN 7.5  E (GLOVE) ×1
GLOVE SURG SYN 7.5 E (GLOVE) ×1 IMPLANT
GOWN STRL REUS W/ TWL LRG LVL3 (GOWN DISPOSABLE) ×2 IMPLANT
GOWN STRL REUS W/TWL LRG LVL3 (GOWN DISPOSABLE) ×2
KIT BASIN OR (CUSTOM PROCEDURE TRAY) ×2 IMPLANT
KIT TURNOVER KIT B (KITS) ×2 IMPLANT
LENS BIOM SUPER VIEW SET DISP (MISCELLANEOUS) ×2 IMPLANT
NEEDLE 18GX1X1/2 (RX/OR ONLY) (NEEDLE) ×2 IMPLANT
NEEDLE 27GX1/2 REG BEVEL ECLIP (NEEDLE) ×2 IMPLANT
NEEDLE FILTER BLUNT 18X 1/2SAF (NEEDLE) ×1
NEEDLE FILTER BLUNT 18X1 1/2 (NEEDLE) ×1 IMPLANT
NEEDLE RETROBULBAR 25GX1.5 (NEEDLE) IMPLANT
NS IRRIG 1000ML POUR BTL (IV SOLUTION) ×2 IMPLANT
PACK VITRECTOMY CUSTOM (CUSTOM PROCEDURE TRAY) ×2 IMPLANT
PAD ARMBOARD 7.5X6 YLW CONV (MISCELLANEOUS) ×2 IMPLANT
PAK PIK VITRECTOMY CVS 25GA (OPHTHALMIC) ×2 IMPLANT
PENCIL BIPOLAR 25GA STR DISP (OPHTHALMIC RELATED) ×2 IMPLANT
PROBE ENDO DIATHERMY 25G (MISCELLANEOUS) IMPLANT
PROBE LASER ILLUM FLEX CVD 25G (OPHTHALMIC) ×2 IMPLANT
SCRAPER DIAMOND 25GA (OPHTHALMIC RELATED) IMPLANT
SHIELD EYE LENSE ONLY DISP (GAUZE/BANDAGES/DRESSINGS) ×2 IMPLANT
STOPCOCK 4 WAY LG BORE MALE ST (IV SETS) IMPLANT
SUT VICRYL 7 0 TG140 8 (SUTURE) IMPLANT
SUT VICRYL 8 0 TG140 8 (SUTURE) IMPLANT
SYR TB 1ML LUER SLIP (SYRINGE) ×2 IMPLANT
WATER STERILE IRR 1000ML POUR (IV SOLUTION) ×2 IMPLANT
WIPE INSTRUMENT VISIWIPE 73X73 (MISCELLANEOUS) ×2 IMPLANT

## 2020-12-12 NOTE — Transfer of Care (Signed)
Immediate Anesthesia Transfer of Care Note  Patient: Alice Travis  Procedure(s) Performed: REPAIR OF COMPLEX TRACTION RETINAL DETACHMENT LEFT EYE (Left Eye)  Patient Location: PACU  Anesthesia Type:General  Level of Consciousness: drowsy, patient cooperative and responds to stimulation  Airway & Oxygen Therapy: Patient Spontanous Breathing  Post-op Assessment: Report given to RN and Post -op Vital signs reviewed and stable  Post vital signs: Reviewed and stable  Last Vitals:  Vitals Value Taken Time  BP 118/75 12/12/20 2209  Temp    Pulse 83 12/12/20 2210  Resp 18 12/12/20 2210  SpO2 100 % 12/12/20 2210  Vitals shown include unvalidated device data.  Last Pain:  Vitals:   12/12/20 1521  TempSrc:   PainSc: 4       Patients Stated Pain Goal: 4 (12/12/20 1521)  Complications: No complications documented.

## 2020-12-12 NOTE — Progress Notes (Signed)
Patient states they had a bagel with cream cheese at 0940.  Dr. Maple Hudson aware.

## 2020-12-12 NOTE — Anesthesia Procedure Notes (Signed)
Procedure Name: LMA Insertion Date/Time: 12/12/2020 7:58 PM Performed by: Tillman Abide, CRNA Pre-anesthesia Checklist: Patient identified, Emergency Drugs available, Suction available and Patient being monitored Patient Re-evaluated:Patient Re-evaluated prior to induction Oxygen Delivery Method: Circle System Utilized Preoxygenation: Pre-oxygenation with 100% oxygen Induction Type: IV induction Ventilation: Mask ventilation without difficulty LMA: LMA inserted LMA Size: 4.0 Number of attempts: 1 Placement Confirmation: positive ETCO2 Tube secured with: Tape Dental Injury: Teeth and Oropharynx as per pre-operative assessment

## 2020-12-12 NOTE — Anesthesia Preprocedure Evaluation (Addendum)
Anesthesia Evaluation  Patient identified by MRN, date of birth, ID band Patient awake    Reviewed: Allergy & Precautions, NPO status , Patient's Chart, lab work & pertinent test results  Airway Mallampati: II  TM Distance: >3 FB Neck ROM: Full    Dental  (+) Teeth Intact, Dental Advisory Given   Pulmonary former smoker,    Pulmonary exam normal breath sounds clear to auscultation       Cardiovascular negative cardio ROS Normal cardiovascular exam Rhythm:Regular Rate:Normal     Neuro/Psych  Headaches, Multiple sclerosis- dec 2021 last flare Retinal detachment  negative psych ROS   GI/Hepatic negative GI ROS, Neg liver ROS,   Endo/Other  negative endocrine ROS  Renal/GU negative Renal ROS  negative genitourinary   Musculoskeletal negative musculoskeletal ROS (+)   Abdominal   Peds  Hematology negative hematology ROS (+)   Anesthesia Other Findings Day of surgery medications reviewed with the patient.  Reproductive/Obstetrics negative OB ROS                            Anesthesia Physical Anesthesia Plan  ASA: III  Anesthesia Plan: General   Post-op Pain Management:    Induction: Intravenous and Rapid sequence  PONV Risk Score and Plan: 3  Airway Management Planned: Oral ETT  Additional Equipment: None  Intra-op Plan:   Post-operative Plan: Extubation in OR  Informed Consent: I have reviewed the patients History and Physical, chart, labs and discussed the procedure including the risks, benefits and alternatives for the proposed anesthesia with the patient or authorized representative who has indicated his/her understanding and acceptance.     Dental advisory given  Plan Discussed with: CRNA  Anesthesia Plan Comments:        Anesthesia Quick Evaluation

## 2020-12-12 NOTE — H&P (Signed)
,   Date of examination:  12/12/20  Indication for surgery: retinal detachment left eye  Pertinent past medical history:  Past Medical History:  Diagnosis Date  . Migraines   . MS (multiple sclerosis) (HCC)   . Retinal detachment     Pertinent ocular history:  Prior retinal detachment repair left eye 3 years prior  Pertinent family history:  Family History  Problem Relation Age of Onset  . Diabetes Mellitus II Father     General:  Healthy appearing patient in no distress.    Eyes:    Acuity  OS HM  External: Within normal limits    Anterior segment: Within normal limits      Fundus: total retinal detachment left eye       Impression: Total retinal detachment left eye  Plan: retinal detachment repair left eye  Carmela Rima, MD

## 2020-12-12 NOTE — Op Note (Signed)
Alice Travis 12/12/2020 Diagnosis: Retinal detachment left eye with multiple breaks  Procedure: [place] Vitrectomy, Endolaser, Fluid Gas Exchange and external drainage of subretinal fluid  Operative Eye:  left eye  Surgeon: Harrold Donath Estimated Blood Loss: minimal Specimens for Pathology:  None Complications: none   The  patient was prepped and draped in the usual fashion for ocular surgery on the  left eye .  A lid speculum was placed.  A 20 gauge MVR was used to make a limbal clear corneal incision at three o'clock.  A 27 gauge needle was placed into the subretinal space at 1 o'clock.  Approximately 1.5cc of subretinal fluid was drained passively.  The bullous detachment was flattened to allow for placement of the 25 gauge cannulas.  Infusion line and trocar was placed at the 4 o'clock position approximately 3.5 mm from the surgical limbus.   The infusion line was allowed to run and then clamped when placed at the cannula opening. The line was inserted and secured to the drape with an adhesive strip.   Active trocars/cannula were placed at the 10 and 2 o'clock positions approximately 3.5 mm from the surgical limbus. The cannula was visualized in the vitreous cavity.  The light pipe and vitreous cutter were inserted into the vitreous cavity and a careful perfipheral vitrectomy was performed with the aid of scleral depression.  Prior to performing peripheral vitrectomy, subretinal fluid was drained through the existing posterior retinotomy.  Care taken to remove the vitreous up to the vitreous base for 360 degrees.   A complete air-fluid exchange was performed.  Subretinal fluid was drained through the posterior retinotomy.  3 rows of endolaser were applied 360 degrees to the periphery and surrounding the posterior retinotomy and retinal breaks.  16% C3F8 gas was placed in the eye.  The superior cannulas were sequentially removed with concommitant tamponade using a cotton tipped  applicator and noted to be air tight.  The infusion line and trocar were removed and the sclerotomy was noted to be air tight with normal intraocular pressure by digital palpapation.  Subconjunctival injections of Ancef and  Dexamethasone 4mg /8ml was placed in the infero-medial quadrant.  The speculum and drapes were removed and the eye was patched with Polymixin/Bacitracin ophthalmic ointment. An eye shield was placed and the patient was transferred alert and conversant with stable vital signs to the post operative recovery area.  The patient tolerated the procedure well and no complications were noted.  0m MD

## 2020-12-12 NOTE — Brief Op Note (Signed)
12/12/2020  10:03 PM  PATIENT:  Alice Travis  35 y.o. female  PRE-OPERATIVE DIAGNOSIS:  Retinal Detachment left eye  POST-OPERATIVE DIAGNOSIS:  Retinal Detachment left eye with multiple breaks  PROCEDURE:  Procedure(s): REPAIR OF RETINAL DETACHMENT LEFT EYE with vitrectomy, air/fluid exchange, exterior drainage of subretinal fluid, endolaser, and gas tamponade (Left)  SURGEON:  Surgeon(s) and Role:    Carmela Rima, MD - Primary  PHYSICIAN ASSISTANT:   ASSISTANTS: none   ANESTHESIA:   local and general  EBL:  minimal   BLOOD ADMINISTERED:none  DRAINS: none   LOCAL MEDICATIONS USED:  MARCAINE    and XYLOCAINE   SPECIMEN:  No Specimen  DISPOSITION OF SPECIMEN:  N/A  COUNTS:  YES  TOURNIQUET:  * No tourniquets in log *  DICTATION: .Note written in EPIC  PLAN OF CARE: Admit for overnight observation  PATIENT DISPOSITION:  PACU - hemodynamically stable.   Delay start of Pharmacological VTE agent (>24hrs) due to surgical blood loss or risk of bleeding: not applicable

## 2020-12-13 ENCOUNTER — Encounter (HOSPITAL_COMMUNITY): Payer: Self-pay | Admitting: Ophthalmology

## 2020-12-13 NOTE — Progress Notes (Signed)
D/C instructions reviewed with pt, Copy of instructions given to pt. transportation arrangements to be made with uber service through the hospital, unit charge nurse assisting with setting transportation up.

## 2020-12-13 NOTE — Anesthesia Postprocedure Evaluation (Signed)
Anesthesia Post Note  Patient: Alice Travis  Procedure(s) Performed: REPAIR OF COMPLEX TRACTION RETINAL DETACHMENT LEFT EYE (Left Eye)     Patient location during evaluation: PACU Anesthesia Type: General Level of consciousness: awake and alert Pain management: pain level controlled Vital Signs Assessment: post-procedure vital signs reviewed and stable Respiratory status: spontaneous breathing, nonlabored ventilation, respiratory function stable and patient connected to nasal cannula oxygen Cardiovascular status: blood pressure returned to baseline and stable Postop Assessment: no apparent nausea or vomiting Anesthetic complications: no   No complications documented.  Last Vitals:  Vitals:   12/12/20 2240 12/12/20 2301  BP: 118/76 110/77  Pulse: 79 77  Resp: 15 18  Temp: (!) 36.3 C 36.7 C  SpO2: 98% 98%    Last Pain:  Vitals:   12/13/20 0015  TempSrc:   PainSc: 3                  Kennieth Rad

## 2020-12-13 NOTE — Progress Notes (Signed)
Transport arranged, pt d/c'd via wheelchair with staff escort. Pt going to Dr Eliane Decree (eye surgeon) office from the hospital.

## 2020-12-15 NOTE — Discharge Summary (Signed)
Admission date 12/12/20 Discharge date 12/13/20  Attending: Carmela Rima, MD  Procedure: retinal detachment repair right eye  Admission summary:   Admitted for observation due to patient not having transportation home after general anesthesia  Patch kept in place, no meds administered.  Patient discharged to [facility] for post-operative appointment.

## 2022-06-08 IMAGING — MR MR CERVICAL SPINE WO/W CM
4 of 8 series · 19 of 48 positions shown · IV contrast (8 ML GAD)
Comparison: None.

CLINICAL DATA: Multiple sclerosis.  New clinical of vent.

EXAM:
MRI CERVICAL SPINE WITHOUT AND WITH CONTRAST
TECHNIQUE: Multiplanar and multiecho pulse sequences of the cervical spine, to
include the craniocervical junction and cervicothoracic junction,
were obtained without and with intravenous contrast.
CONTRAST:  8mL GADAVIST GADOBUTROL 1 MMOL/ML IV SOLN

[Series 11: T2 · sagittal · 3.0mm · 0.35mm/px · 4 of 18 slices shown (1 of 2)]
[im 1/18]
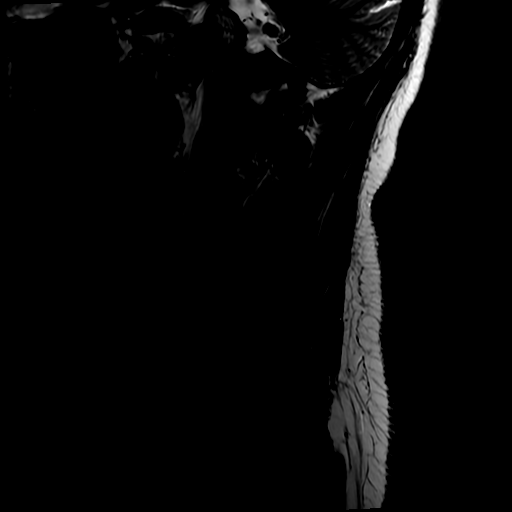
[im 6/18]
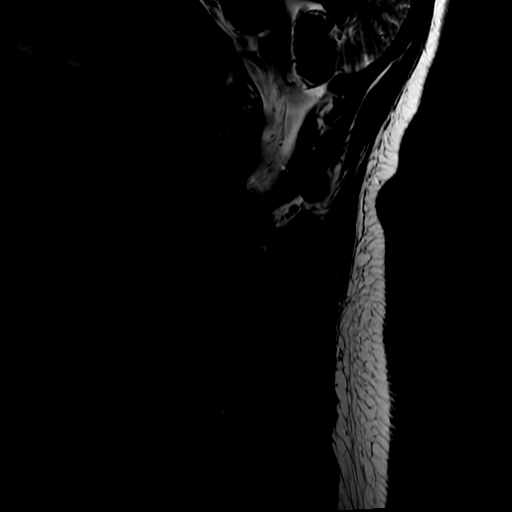
[im 12/18]
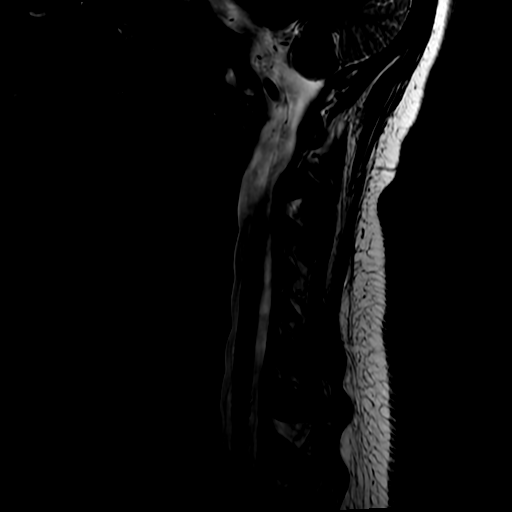
[im 18/18]
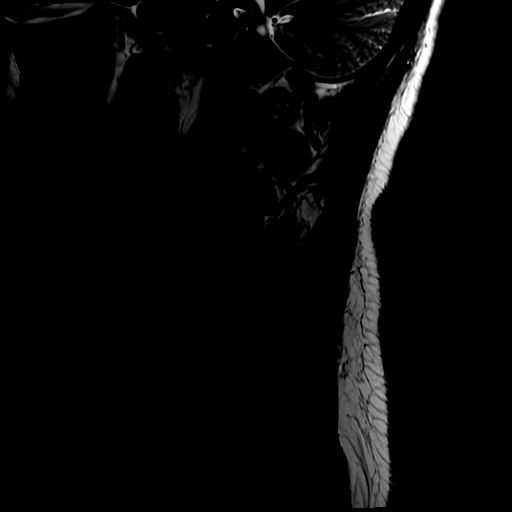

[Series 15: T2 · axial · 3.0mm · 0.35mm/px · z∈[-193,-71]mm · 6 of 37 slices shown (2 of 2)]
[im 1/37]
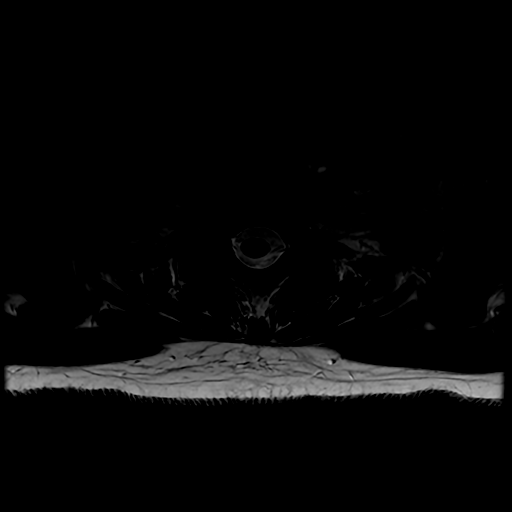
[im 8/37]
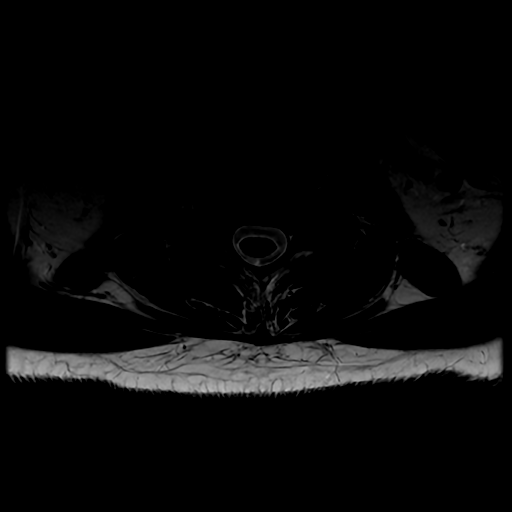
[im 15/37]
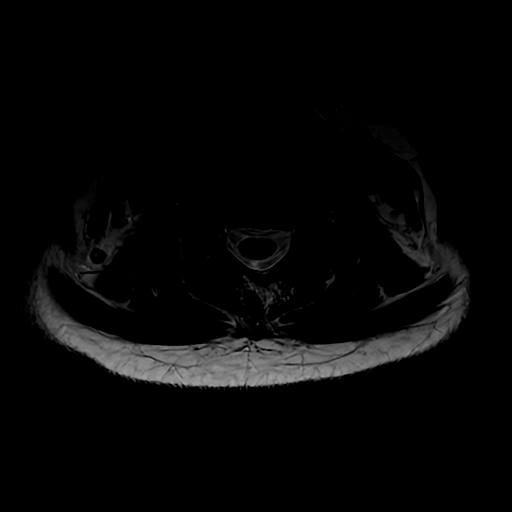
[im 22/37]
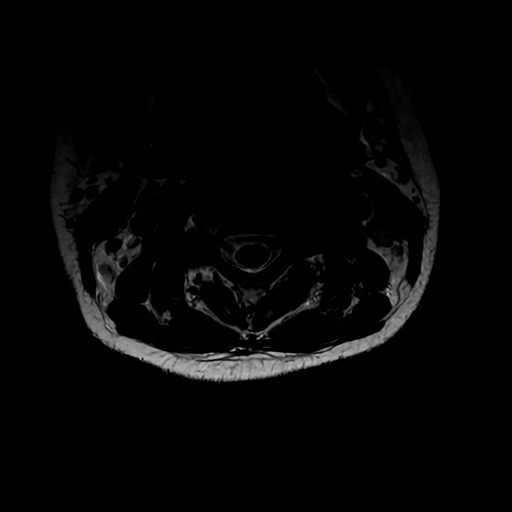
[im 29/37]
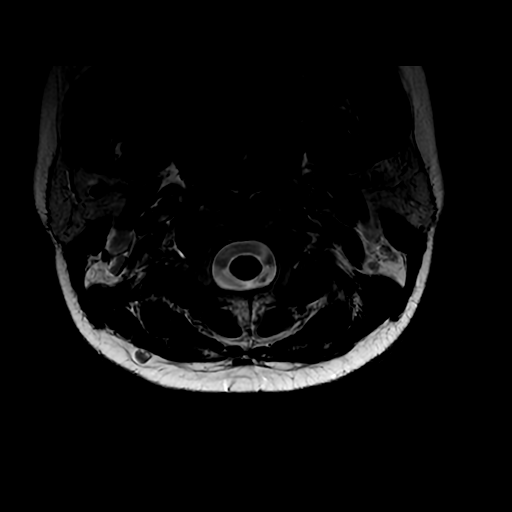
[im 37/37]
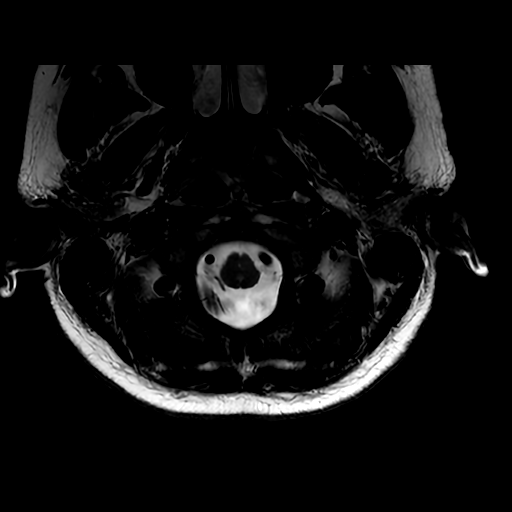

[Series 16: T1 · axial · non-contrast · 3.0mm · 0.35mm/px · z∈[-193,-71]mm · 6 of 38 slices shown]
[im 1/38]
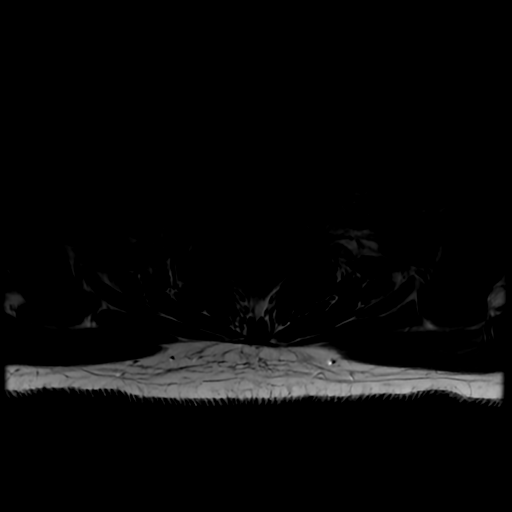
[im 8/38]
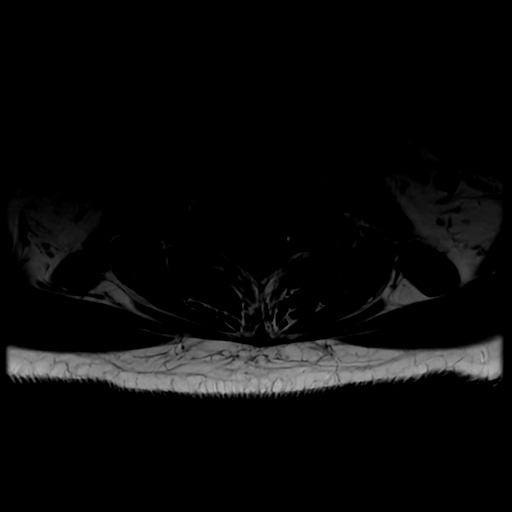
[im 15/38]
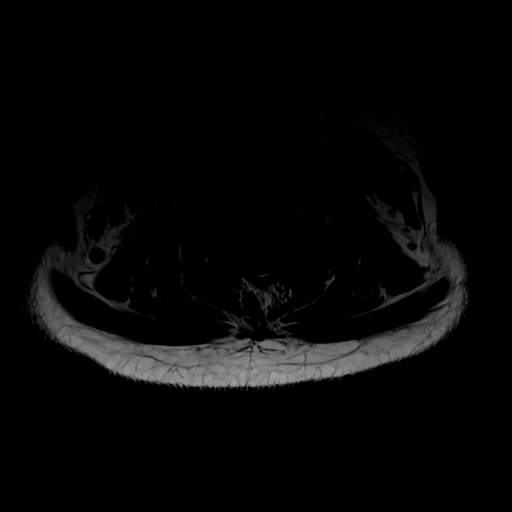
[im 23/38]
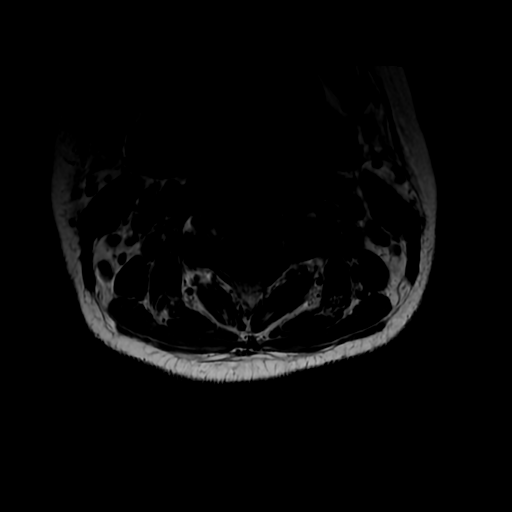
[im 30/38]
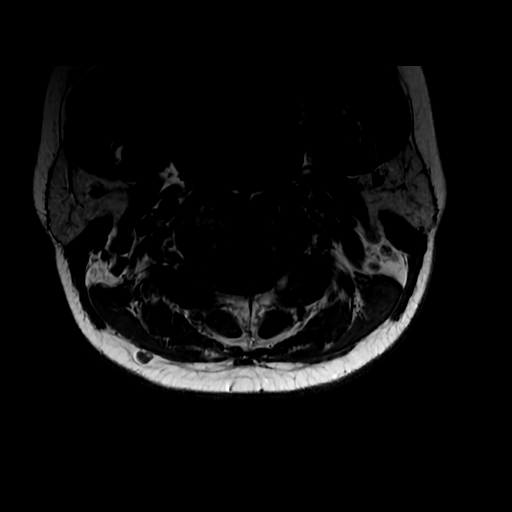
[im 38/38]
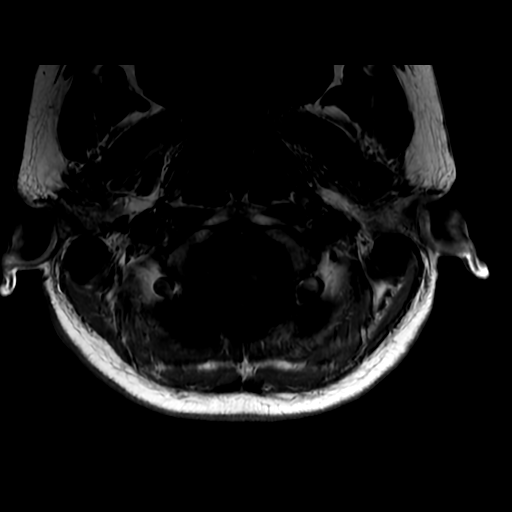

[Series 17: T1 fat-sat post-contrast · sagittal · 3.0mm · 0.35mm/px · 3 of 18 slices shown]
[im 1/18]
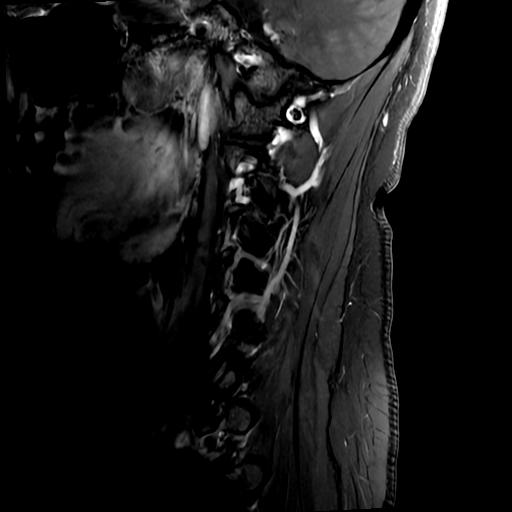
[im 9/18]
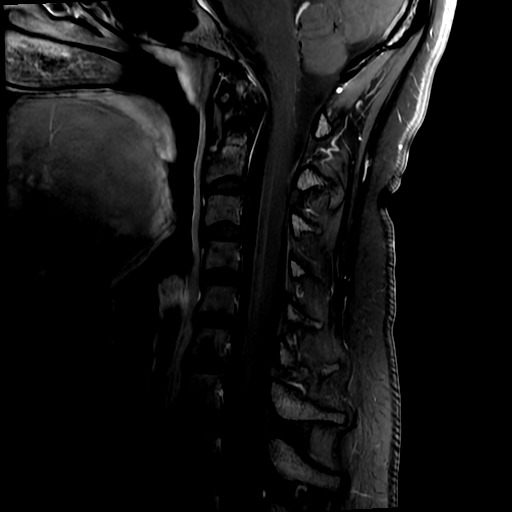
[im 18/18]
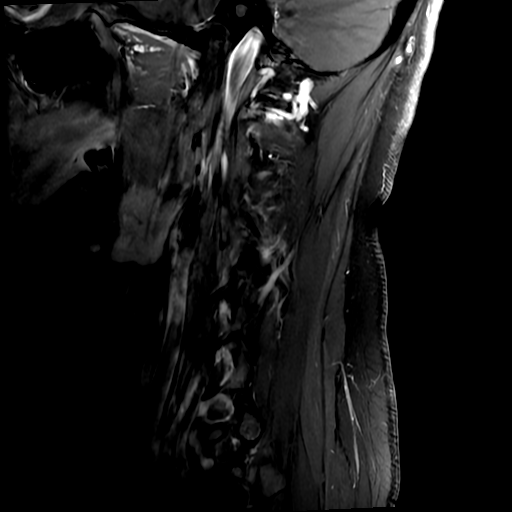

[19 of 48 positions shown; findings below may reference images not displayed]

FINDINGS: Alignment: Normal

Vertebrae: Normal

Cord: No convincing or definite MS lesion of the cervical spine. I
question the presence of a subtle lesion on the right at the C4
level. No other area of concern. No abnormal contrast enhancement
occurs there or elsewhere.

Posterior Fossa, vertebral arteries, paraspinal tissues: See results
of brain MRI.

Disc levels:

No significant degenerative changes. No stenosis of the canal or
foramina.
IMPRESSION: No convincing or definite MS lesion in the cervical spine. I
question the presence of a subtle lesion on the right at the C4
level. No abnormal contrast enhancement. No other area of concern.
